# Patient Record
Sex: Female | Born: 1945
Health system: Southern US, Community
[De-identification: ages and names within clinical notes are randomized; demographics above are authoritative.]

## PROBLEM LIST (undated history)

## (undated) DIAGNOSIS — M5432 Sciatica, left side: Secondary | ICD-10-CM

## (undated) DIAGNOSIS — E785 Hyperlipidemia, unspecified: Secondary | ICD-10-CM

## (undated) DIAGNOSIS — E119 Type 2 diabetes mellitus without complications: Secondary | ICD-10-CM

## (undated) DIAGNOSIS — H029 Unspecified disorder of eyelid: Secondary | ICD-10-CM

## (undated) DIAGNOSIS — K219 Gastro-esophageal reflux disease without esophagitis: Secondary | ICD-10-CM

## (undated) DIAGNOSIS — M1990 Unspecified osteoarthritis, unspecified site: Secondary | ICD-10-CM

## (undated) DIAGNOSIS — Z973 Presence of spectacles and contact lenses: Secondary | ICD-10-CM

## (undated) DIAGNOSIS — I10 Essential (primary) hypertension: Secondary | ICD-10-CM

## (undated) DIAGNOSIS — G629 Polyneuropathy, unspecified: Secondary | ICD-10-CM

## (undated) DIAGNOSIS — K08109 Complete loss of teeth, unspecified cause, unspecified class: Secondary | ICD-10-CM

## (undated) DIAGNOSIS — Z9889 Other specified postprocedural states: Secondary | ICD-10-CM

## (undated) DIAGNOSIS — Z972 Presence of dental prosthetic device (complete) (partial): Secondary | ICD-10-CM

## (undated) DIAGNOSIS — E039 Hypothyroidism, unspecified: Secondary | ICD-10-CM

## (undated) HISTORY — PX: OTHER SURGICAL HISTORY: SHX169

## (undated) HISTORY — PX: ABDOMINAL HYSTERECTOMY: SHX81

## (undated) HISTORY — DX: Gastro-esophageal reflux disease without esophagitis: K21.9

## (undated) HISTORY — PX: THORACOTOMY: SHX5074

## (undated) HISTORY — DX: Essential (primary) hypertension: I10

---

## 1999-02-10 ENCOUNTER — Other Ambulatory Visit: Admission: RE | Admit: 1999-02-10 | Discharge: 1999-02-10 | Payer: Self-pay | Admitting: Obstetrics and Gynecology

## 1999-03-07 ENCOUNTER — Encounter: Payer: Self-pay | Admitting: General Surgery

## 1999-03-07 HISTORY — PX: LAPAROSCOPIC CHOLECYSTECTOMY: SUR755

## 1999-03-09 ENCOUNTER — Ambulatory Visit (HOSPITAL_COMMUNITY): Admission: RE | Admit: 1999-03-09 | Discharge: 1999-03-10 | Payer: Self-pay | Admitting: General Surgery

## 1999-03-09 ENCOUNTER — Encounter: Payer: Self-pay | Admitting: General Surgery

## 2000-02-23 ENCOUNTER — Other Ambulatory Visit: Admission: RE | Admit: 2000-02-23 | Discharge: 2000-02-23 | Payer: Self-pay | Admitting: Obstetrics and Gynecology

## 2001-04-01 ENCOUNTER — Other Ambulatory Visit: Admission: RE | Admit: 2001-04-01 | Discharge: 2001-04-01 | Payer: Self-pay | Admitting: Obstetrics and Gynecology

## 2002-04-08 ENCOUNTER — Other Ambulatory Visit: Admission: RE | Admit: 2002-04-08 | Discharge: 2002-04-08 | Payer: Self-pay | Admitting: Obstetrics and Gynecology

## 2003-04-14 ENCOUNTER — Other Ambulatory Visit: Admission: RE | Admit: 2003-04-14 | Discharge: 2003-04-14 | Payer: Self-pay | Admitting: Obstetrics and Gynecology

## 2004-02-16 ENCOUNTER — Encounter: Admission: RE | Admit: 2004-02-16 | Discharge: 2004-02-16 | Payer: Self-pay | Admitting: Cardiology

## 2004-04-16 ENCOUNTER — Inpatient Hospital Stay (HOSPITAL_COMMUNITY): Admission: RE | Admit: 2004-04-16 | Discharge: 2004-04-17 | Payer: Self-pay | Admitting: General Surgery

## 2004-06-27 ENCOUNTER — Other Ambulatory Visit: Admission: RE | Admit: 2004-06-27 | Discharge: 2004-06-27 | Payer: Self-pay | Admitting: Obstetrics and Gynecology

## 2006-11-21 ENCOUNTER — Encounter: Admission: RE | Admit: 2006-11-21 | Discharge: 2007-02-19 | Payer: Self-pay | Admitting: Internal Medicine

## 2009-09-07 ENCOUNTER — Emergency Department (HOSPITAL_COMMUNITY): Admission: EM | Admit: 2009-09-07 | Discharge: 2009-09-07 | Payer: Self-pay | Admitting: Emergency Medicine

## 2009-09-08 ENCOUNTER — Encounter: Admission: RE | Admit: 2009-09-08 | Discharge: 2009-09-16 | Payer: Self-pay | Admitting: Family Medicine

## 2009-10-19 ENCOUNTER — Emergency Department (HOSPITAL_COMMUNITY): Admission: EM | Admit: 2009-10-19 | Discharge: 2009-10-19 | Payer: Self-pay | Admitting: Family Medicine

## 2010-01-06 ENCOUNTER — Ambulatory Visit: Payer: Self-pay | Admitting: Internal Medicine

## 2010-02-07 ENCOUNTER — Ambulatory Visit: Payer: Self-pay | Admitting: Family Medicine

## 2010-02-07 LAB — CONVERTED CEMR LAB
ALT: 49 units/L — ABNORMAL HIGH (ref 0–35)
AST: 38 units/L — ABNORMAL HIGH (ref 0–37)
Albumin: 4.9 g/dL (ref 3.5–5.2)
Alkaline Phosphatase: 65 units/L (ref 39–117)
BUN: 19 mg/dL (ref 6–23)
Basophils Absolute: 0 10*3/uL (ref 0.0–0.1)
Basophils Relative: 0 % (ref 0–1)
CO2: 19 meq/L (ref 19–32)
Calcium: 9.8 mg/dL (ref 8.4–10.5)
Chloride: 109 meq/L (ref 96–112)
Cholesterol: 119 mg/dL (ref 0–200)
Creatinine, Ser: 0.74 mg/dL (ref 0.40–1.20)
Eosinophils Absolute: 0.1 10*3/uL (ref 0.0–0.7)
Eosinophils Relative: 1 % (ref 0–5)
Glucose, Bld: 175 mg/dL — ABNORMAL HIGH (ref 70–99)
HCT: 41.9 % (ref 36.0–46.0)
HDL: 39 mg/dL — ABNORMAL LOW (ref >39)
Hemoglobin: 12.8 g/dL (ref 12.0–15.0)
LDL Cholesterol: 58 mg/dL (ref 0–99)
Lymphocytes Relative: 46 % (ref 12–46)
Lymphs Abs: 6.4 10*3/uL — ABNORMAL HIGH (ref 0.7–4.0)
MCHC: 30.5 g/dL (ref 30.0–36.0)
MCV: 95 fL (ref 78.0–100.0)
Monocytes Absolute: 0.7 10*3/uL (ref 0.1–1.0)
Monocytes Relative: 5 % (ref 3–12)
Neutro Abs: 6.4 10*3/uL (ref 1.7–7.7)
Neutrophils Relative %: 46 % (ref 43–77)
Platelets: 364 10*3/uL (ref 150–400)
Potassium: 4.1 meq/L (ref 3.5–5.3)
RBC: 4.41 M/uL (ref 3.87–5.11)
RDW: 15.4 % (ref 11.5–15.5)
Sodium: 145 meq/L (ref 135–145)
TSH: 3.074 microintl units/mL (ref 0.350–4.500)
Total Bilirubin: 0.4 mg/dL (ref 0.3–1.2)
Total CHOL/HDL Ratio: 3.1
Total Protein: 7.2 g/dL (ref 6.0–8.3)
Triglycerides: 111 mg/dL (ref <150)
VLDL: 22 mg/dL (ref 0–40)
Vit D, 25-Hydroxy: 18 ng/mL — ABNORMAL LOW (ref 30–89)
WBC: 13.7 10*3/uL — ABNORMAL HIGH (ref 4.0–10.5)

## 2010-02-10 ENCOUNTER — Ambulatory Visit: Payer: Self-pay | Admitting: Internal Medicine

## 2010-02-17 ENCOUNTER — Ambulatory Visit (HOSPITAL_COMMUNITY): Admission: RE | Admit: 2010-02-17 | Discharge: 2010-02-17 | Payer: Self-pay | Admitting: Internal Medicine

## 2010-03-10 ENCOUNTER — Encounter (INDEPENDENT_AMBULATORY_CARE_PROVIDER_SITE_OTHER): Payer: Self-pay | Admitting: Family Medicine

## 2010-03-10 ENCOUNTER — Ambulatory Visit: Payer: Self-pay | Admitting: Internal Medicine

## 2010-03-10 LAB — CONVERTED CEMR LAB
ALT: 36 units/L — ABNORMAL HIGH (ref 0–35)
AST: 36 units/L (ref 0–37)
Albumin: 4.5 g/dL (ref 3.5–5.2)
Alkaline Phosphatase: 64 units/L (ref 39–117)
BUN: 18 mg/dL (ref 6–23)
Basophils Absolute: 0 10*3/uL (ref 0.0–0.1)
Basophils Relative: 0 % (ref 0–1)
CO2: 23 meq/L (ref 19–32)
Calcium: 10.3 mg/dL (ref 8.4–10.5)
Chloride: 105 meq/L (ref 96–112)
Creatinine, Ser: 0.88 mg/dL (ref 0.40–1.20)
Eosinophils Absolute: 0.1 10*3/uL (ref 0.0–0.7)
Eosinophils Relative: 1 % (ref 0–5)
Glucose, Bld: 117 mg/dL — ABNORMAL HIGH (ref 70–99)
HCT: 41.5 % (ref 36.0–46.0)
Hemoglobin: 12.9 g/dL (ref 12.0–15.0)
Lymphocytes Relative: 45 % (ref 12–46)
Lymphs Abs: 5.8 10*3/uL — ABNORMAL HIGH (ref 0.7–4.0)
MCHC: 31.1 g/dL (ref 30.0–36.0)
MCV: 95.8 fL (ref 78.0–100.0)
Monocytes Absolute: 0.9 10*3/uL (ref 0.1–1.0)
Monocytes Relative: 7 % (ref 3–12)
Neutro Abs: 6.2 10*3/uL (ref 1.7–7.7)
Neutrophils Relative %: 48 % (ref 43–77)
Platelets: 379 10*3/uL (ref 150–400)
Potassium: 4.4 meq/L (ref 3.5–5.3)
RBC: 4.33 M/uL (ref 3.87–5.11)
RDW: 15.7 % — ABNORMAL HIGH (ref 11.5–15.5)
Sodium: 141 meq/L (ref 135–145)
Total Bilirubin: 0.4 mg/dL (ref 0.3–1.2)
Total Protein: 7.2 g/dL (ref 6.0–8.3)
WBC: 13 10*3/uL — ABNORMAL HIGH (ref 4.0–10.5)

## 2010-03-11 ENCOUNTER — Encounter (INDEPENDENT_AMBULATORY_CARE_PROVIDER_SITE_OTHER): Payer: Self-pay | Admitting: Family Medicine

## 2010-03-11 LAB — CONVERTED CEMR LAB
HCV Ab: NEGATIVE
Hep A Total Ab: POSITIVE — AB
Hep B Core Total Ab: NEGATIVE
Hep B S Ab: NEGATIVE

## 2010-03-17 ENCOUNTER — Encounter (INDEPENDENT_AMBULATORY_CARE_PROVIDER_SITE_OTHER): Payer: Self-pay | Admitting: Family Medicine

## 2010-03-17 ENCOUNTER — Ambulatory Visit: Payer: Self-pay | Admitting: Internal Medicine

## 2010-03-17 LAB — CONVERTED CEMR LAB
Absolute CD4: 2013 #/uL — ABNORMAL HIGH (ref 381–1469)
Basophils Absolute: 0.1 10*3/uL (ref 0.0–0.1)
Basophils Relative: 0 % (ref 0–1)
CD4 T Helper %: 40 % (ref 32–62)
CRP: 0.9 mg/dL — ABNORMAL HIGH (ref <0.6)
EBV NA IgG: 2.12 — ABNORMAL HIGH
EBV VCA IgG: 6.22 — ABNORMAL HIGH
EBV VCA IgM: 0.22
Eosinophils Absolute: 0.2 10*3/uL (ref 0.0–0.7)
Eosinophils Relative: 1 % (ref 0–5)
HCT: 41.4 % (ref 36.0–46.0)
Hemoglobin: 13.1 g/dL (ref 12.0–15.0)
Lymphocytes Relative: 36 % (ref 12–46)
Lymphs Abs: 4.9 10*3/uL — ABNORMAL HIGH (ref 0.7–4.0)
MCHC: 31.6 g/dL (ref 30.0–36.0)
MCV: 95.2 fL (ref 78.0–100.0)
Monocytes Absolute: 0.9 10*3/uL (ref 0.1–1.0)
Monocytes Relative: 6 % (ref 3–12)
Neutro Abs: 7.5 10*3/uL (ref 1.7–7.7)
Neutrophils Relative %: 56 % (ref 43–77)
Platelets: 354 10*3/uL (ref 150–400)
RBC: 4.35 M/uL (ref 3.87–5.11)
RDW: 15.7 % — ABNORMAL HIGH (ref 11.5–15.5)
Sed Rate: 22 mm/hr (ref 0–22)
Total lymphocyte count: 5032 cells/mcL — ABNORMAL HIGH (ref 700–3300)
WBC: 13.5 10*3/uL — ABNORMAL HIGH (ref 4.0–10.5)

## 2010-04-19 ENCOUNTER — Ambulatory Visit: Payer: Self-pay | Admitting: Family Medicine

## 2011-02-07 LAB — GLUCOSE, CAPILLARY: Glucose-Capillary: 288 mg/dL — ABNORMAL HIGH (ref 70–99)

## 2011-02-08 LAB — POCT I-STAT, CHEM 8
BUN: 16 mg/dL (ref 6–23)
Calcium, Ion: 1.18 mmol/L (ref 1.12–1.32)
Chloride: 105 mEq/L (ref 96–112)
Creatinine, Ser: 0.6 mg/dL (ref 0.4–1.2)
Glucose, Bld: 360 mg/dL — ABNORMAL HIGH (ref 70–99)
HCT: 44 % (ref 36.0–46.0)
Hemoglobin: 15 g/dL (ref 12.0–15.0)
Potassium: 4 mEq/L (ref 3.5–5.1)
Sodium: 139 mEq/L (ref 135–145)
TCO2: 25 mmol/L (ref 0–100)

## 2011-02-08 LAB — HEMOGLOBIN A1C
Hgb A1c MFr Bld: 11.8 % — ABNORMAL HIGH (ref 4.6–6.1)
Mean Plasma Glucose: 292 mg/dL

## 2011-03-24 NOTE — Op Note (Signed)
NAME:  Vanessa Morrow, Vanessa Morrow                           ACCOUNT NO.:  [id number]   MEDICAL RECORD NO.:  [id number]                   PATIENT TYPE:  AMB   LOCATION:  DAY                                  FACILITY:  [facility]   PHYSICIAN:  Sharlet Salina T. Hoxworth, M.D.          DATE OF BIRTH:  1946/10/29   DATE OF PROCEDURE:  04/15/2004  DATE OF DISCHARGE:                                 OPERATIVE REPORT   PREOPERATIVE DIAGNOSIS:  Ventral abdominal hernia.   POSTOPERATIVE DIAGNOSIS:  Ventral abdominal hernia.   SURGICAL PROCEDURE:  Laparoscopic repair of ventral abdominal hernia.   SURGEON:  Lorne Skeens. Hoxworth, M.D.   ANESTHESIA:  General.   BRIEF HISTORY:  Vanessa Morrow is a 65 year old black female who I have known  for several years with an enlarging and symptomatic, essentially very  discrete diastasis recti or primary upper abdominal ventral hernia.  She has  had repair remotely of an umbilical hernia with a significant transverse  incision just above the umbilicus.  She now has a good sized, very discrete  bulge extending from several centimeters above the umbilicus to near the  xiphoid.  This gives her significant discomfort.  We have elected to proceed  with laparoscopic repair.  The nature of the procedure, indications, risks  of bleeding, infection, intestinal injury, and recurrence were discussed x2.  She was taken to the operating room for this procedure.   DESCRIPTION OF PROCEDURE:  Patient was brought to the operating room and  placed in a supine position on the operating room table.  General  endotracheal anesthesia was induced.  PAS were in place.  She received broad-  spectrum antibiotics.  The abdomen was widely sterilely prepped and draped.  Access was obtained well laterally in the left flank with a 1 cm incision  under direct vision using the Hasson trocar without difficulty.  Through a  mattress suture of 0 Vicryl, the Hasson was inserted, and pneumoperitoneum  established.   There were no adhesions to the anterior abdominal wall.  There  could be seen scarring around the umbilical area from her previous hernia  repair.  There was a rather broad, diffuse bulge in the epigastrium.  She  had a prominent falciform ligament which I felt needed to be taken down to  allow the mesh to seat.  Two additional trocars were placed, 5 mm.  One in  the upper left flank and one in the right flank.  Using a harmonic scalpel,  the falciform ligament was completely taken down off the anterior abdominal  wall to allow seating of the mesh.  The defect was measured.  I then used a  20 x 15 cm piece of Parietex dual mesh, which allowed broad coverage of the  entire area with several centimeter overlap in all directions.  Six 0  Prolene sutures were placed around the periphery of the mesh.  Six  corresponding sites on the  anterior abdominal wall were then marked several  centimeters outside of the mesh, and small stab incisions were made at these  points.  The mesh was moistened, curled, and passed into the abdominal  cavity without difficulty.  It was unfurled and oriented.  Suture grafts  were then used to bring the sutures up through the individual small stab  incisions.  The mesh was then elevated with the sutures, and it seemed to be  nicely, very tautly deployed across the entire upper abdomen.  These sutures  were then tied and cut.  [place]ak was then used to circumferentially tack the  edge of the mesh.  The bed was inspected for hemostasis, which appeared  complete.  Trocars were removed and all  CO2 evacuated.  The mattress suture was secured at the [place] site.  Skin  incisions were closed with interrupted subcuticular 4-0 Monocryl and Steri-  Strips.  Sponge and needle counts were correct.  Dry sterile dressings were  applied.  Patient was taken to the recovery room in good condition.                                               Lorne Skeens. Hoxworth, M.D.    Tory Emerald   D:  04/15/2004  T:  04/15/2004  Job:  [id number]

## 2013-03-26 ENCOUNTER — Encounter: Payer: Self-pay | Admitting: Pulmonary Disease

## 2013-03-27 ENCOUNTER — Institutional Professional Consult (permissible substitution): Payer: Self-pay | Admitting: Pulmonary Disease

## 2013-04-21 ENCOUNTER — Institutional Professional Consult (permissible substitution): Payer: Self-pay | Admitting: Pulmonary Disease

## 2013-05-20 ENCOUNTER — Institutional Professional Consult (permissible substitution): Payer: Self-pay | Admitting: Pulmonary Disease

## 2013-05-22 ENCOUNTER — Encounter: Payer: Self-pay | Admitting: Pulmonary Disease

## 2014-11-12 DIAGNOSIS — G4700 Insomnia, unspecified: Secondary | ICD-10-CM | POA: Diagnosis not present

## 2014-11-12 DIAGNOSIS — I10 Essential (primary) hypertension: Secondary | ICD-10-CM | POA: Diagnosis not present

## 2014-11-12 DIAGNOSIS — K30 Functional dyspepsia: Secondary | ICD-10-CM | POA: Diagnosis not present

## 2014-11-12 DIAGNOSIS — M2550 Pain in unspecified joint: Secondary | ICD-10-CM | POA: Diagnosis not present

## 2014-11-12 DIAGNOSIS — E119 Type 2 diabetes mellitus without complications: Secondary | ICD-10-CM | POA: Diagnosis not present

## 2014-11-12 DIAGNOSIS — E1151 Type 2 diabetes mellitus with diabetic peripheral angiopathy without gangrene: Secondary | ICD-10-CM | POA: Diagnosis not present

## 2015-01-12 DIAGNOSIS — Z01118 Encounter for examination of ears and hearing with other abnormal findings: Secondary | ICD-10-CM | POA: Diagnosis not present

## 2015-01-12 DIAGNOSIS — Z7689 Persons encountering health services in other specified circumstances: Secondary | ICD-10-CM | POA: Diagnosis not present

## 2015-01-12 DIAGNOSIS — E1151 Type 2 diabetes mellitus with diabetic peripheral angiopathy without gangrene: Secondary | ICD-10-CM | POA: Diagnosis not present

## 2015-01-12 DIAGNOSIS — Z136 Encounter for screening for cardiovascular disorders: Secondary | ICD-10-CM | POA: Diagnosis not present

## 2015-01-12 DIAGNOSIS — Z1389 Encounter for screening for other disorder: Secondary | ICD-10-CM | POA: Diagnosis not present

## 2015-01-12 DIAGNOSIS — Z0000 Encounter for general adult medical examination without abnormal findings: Secondary | ICD-10-CM | POA: Diagnosis not present

## 2015-01-12 DIAGNOSIS — I10 Essential (primary) hypertension: Secondary | ICD-10-CM | POA: Diagnosis not present

## 2015-01-12 DIAGNOSIS — E119 Type 2 diabetes mellitus without complications: Secondary | ICD-10-CM | POA: Diagnosis not present

## 2015-01-12 DIAGNOSIS — Z113 Encounter for screening for infections with a predominantly sexual mode of transmission: Secondary | ICD-10-CM | POA: Diagnosis not present

## 2015-02-26 DIAGNOSIS — K30 Functional dyspepsia: Secondary | ICD-10-CM | POA: Diagnosis not present

## 2015-02-26 DIAGNOSIS — G4700 Insomnia, unspecified: Secondary | ICD-10-CM | POA: Diagnosis not present

## 2015-02-26 DIAGNOSIS — E1151 Type 2 diabetes mellitus with diabetic peripheral angiopathy without gangrene: Secondary | ICD-10-CM | POA: Diagnosis not present

## 2015-02-26 DIAGNOSIS — I10 Essential (primary) hypertension: Secondary | ICD-10-CM | POA: Diagnosis not present

## 2015-02-26 DIAGNOSIS — R635 Abnormal weight gain: Secondary | ICD-10-CM | POA: Diagnosis not present

## 2015-06-30 DIAGNOSIS — Z1231 Encounter for screening mammogram for malignant neoplasm of breast: Secondary | ICD-10-CM | POA: Diagnosis not present

## 2015-07-23 DIAGNOSIS — R0602 Shortness of breath: Secondary | ICD-10-CM | POA: Diagnosis not present

## 2015-07-23 DIAGNOSIS — E1151 Type 2 diabetes mellitus with diabetic peripheral angiopathy without gangrene: Secondary | ICD-10-CM | POA: Diagnosis not present

## 2015-07-23 DIAGNOSIS — I10 Essential (primary) hypertension: Secondary | ICD-10-CM | POA: Diagnosis not present

## 2015-07-23 DIAGNOSIS — E1140 Type 2 diabetes mellitus with diabetic neuropathy, unspecified: Secondary | ICD-10-CM | POA: Diagnosis not present

## 2015-07-23 DIAGNOSIS — R079 Chest pain, unspecified: Secondary | ICD-10-CM | POA: Diagnosis not present

## 2015-07-23 DIAGNOSIS — E039 Hypothyroidism, unspecified: Secondary | ICD-10-CM | POA: Diagnosis not present

## 2015-08-13 DIAGNOSIS — I10 Essential (primary) hypertension: Secondary | ICD-10-CM | POA: Diagnosis not present

## 2015-08-27 DIAGNOSIS — E039 Hypothyroidism, unspecified: Secondary | ICD-10-CM | POA: Diagnosis not present

## 2015-08-27 DIAGNOSIS — E1151 Type 2 diabetes mellitus with diabetic peripheral angiopathy without gangrene: Secondary | ICD-10-CM | POA: Diagnosis not present

## 2015-08-27 DIAGNOSIS — E1140 Type 2 diabetes mellitus with diabetic neuropathy, unspecified: Secondary | ICD-10-CM | POA: Diagnosis not present

## 2015-08-27 DIAGNOSIS — Z0000 Encounter for general adult medical examination without abnormal findings: Secondary | ICD-10-CM | POA: Diagnosis not present

## 2015-08-27 DIAGNOSIS — Z1389 Encounter for screening for other disorder: Secondary | ICD-10-CM | POA: Diagnosis not present

## 2015-10-08 DIAGNOSIS — H538 Other visual disturbances: Secondary | ICD-10-CM | POA: Diagnosis not present

## 2015-10-08 DIAGNOSIS — H43811 Vitreous degeneration, right eye: Secondary | ICD-10-CM | POA: Diagnosis not present

## 2015-10-08 DIAGNOSIS — H2511 Age-related nuclear cataract, right eye: Secondary | ICD-10-CM | POA: Diagnosis not present

## 2015-11-25 DIAGNOSIS — E1151 Type 2 diabetes mellitus with diabetic peripheral angiopathy without gangrene: Secondary | ICD-10-CM | POA: Diagnosis not present

## 2015-11-25 DIAGNOSIS — E1140 Type 2 diabetes mellitus with diabetic neuropathy, unspecified: Secondary | ICD-10-CM | POA: Diagnosis not present

## 2015-11-25 DIAGNOSIS — I10 Essential (primary) hypertension: Secondary | ICD-10-CM | POA: Diagnosis not present

## 2015-11-25 DIAGNOSIS — K219 Gastro-esophageal reflux disease without esophagitis: Secondary | ICD-10-CM | POA: Diagnosis not present

## 2015-11-25 DIAGNOSIS — E039 Hypothyroidism, unspecified: Secondary | ICD-10-CM | POA: Diagnosis not present

## 2015-12-03 DIAGNOSIS — I209 Angina pectoris, unspecified: Secondary | ICD-10-CM | POA: Diagnosis not present

## 2015-12-03 DIAGNOSIS — I10 Essential (primary) hypertension: Secondary | ICD-10-CM | POA: Diagnosis not present

## 2015-12-03 DIAGNOSIS — E119 Type 2 diabetes mellitus without complications: Secondary | ICD-10-CM | POA: Diagnosis not present

## 2015-12-03 DIAGNOSIS — R0609 Other forms of dyspnea: Secondary | ICD-10-CM | POA: Diagnosis not present

## 2015-12-04 ENCOUNTER — Emergency Department (HOSPITAL_COMMUNITY): Payer: Medicare Other

## 2015-12-04 ENCOUNTER — Emergency Department (HOSPITAL_COMMUNITY)
Admission: EM | Admit: 2015-12-04 | Discharge: 2015-12-04 | Disposition: A | Discharge status: Home / Self Care | Payer: Medicare Other | Attending: Emergency Medicine | Admitting: Emergency Medicine

## 2015-12-04 ENCOUNTER — Encounter (HOSPITAL_COMMUNITY): Payer: Self-pay | Admitting: *Deleted

## 2015-12-04 DIAGNOSIS — Z79899 Other long term (current) drug therapy: Secondary | ICD-10-CM | POA: Diagnosis not present

## 2015-12-04 DIAGNOSIS — S6992XA Unspecified injury of left wrist, hand and finger(s), initial encounter: Secondary | ICD-10-CM | POA: Insufficient documentation

## 2015-12-04 DIAGNOSIS — Y9389 Activity, other specified: Secondary | ICD-10-CM | POA: Insufficient documentation

## 2015-12-04 DIAGNOSIS — M25532 Pain in left wrist: Secondary | ICD-10-CM

## 2015-12-04 DIAGNOSIS — Z7984 Long term (current) use of oral hypoglycemic drugs: Secondary | ICD-10-CM | POA: Diagnosis not present

## 2015-12-04 DIAGNOSIS — X500XXA Overexertion from strenuous movement or load, initial encounter: Secondary | ICD-10-CM | POA: Diagnosis not present

## 2015-12-04 DIAGNOSIS — E119 Type 2 diabetes mellitus without complications: Secondary | ICD-10-CM | POA: Diagnosis not present

## 2015-12-04 DIAGNOSIS — Y998 Other external cause status: Secondary | ICD-10-CM | POA: Diagnosis not present

## 2015-12-04 DIAGNOSIS — K219 Gastro-esophageal reflux disease without esophagitis: Secondary | ICD-10-CM | POA: Diagnosis not present

## 2015-12-04 DIAGNOSIS — I10 Essential (primary) hypertension: Secondary | ICD-10-CM | POA: Diagnosis not present

## 2015-12-04 DIAGNOSIS — Y9289 Other specified places as the place of occurrence of the external cause: Secondary | ICD-10-CM | POA: Diagnosis not present

## 2015-12-04 MED ORDER — ACETAMINOPHEN 500 MG PO TABS: 500.0000 mg | ORAL_TABLET | Freq: Four times a day (QID) | ORAL | Status: DC | PRN

## 2015-12-04 MED ORDER — OXYCODONE-ACETAMINOPHEN 5-325 MG PO TABS
1.0000 | ORAL_TABLET | Freq: Once | ORAL | Status: AC
  Administered 2015-12-04: 1 via ORAL

## 2015-12-04 MED ORDER — OXYCODONE-ACETAMINOPHEN 5-325 MG PO TABS
ORAL_TABLET | ORAL | Status: AC
  Filled 2015-12-04: qty 1

## 2015-12-04 NOTE — ED Provider Notes (Signed)
Medical screening examination/treatment/procedure(s) were conducted as a shared visit with non-physician practitioner(s) and myself.  I personally evaluated the patient during the encounter.   EKG Interpretation None       Results for orders placed or performed in visit on 03/17/10  Converted [facility] Lab  Result Value Ref Range   WBC 13.5 (H) 4.0-10.5 10*3/microliter   RBC 4.35 3.87-5.11 M/uL   Hemoglobin 13.1 12.0-15.0 g/dL   HCT 41.4 36.0-46.0 %   MCV 95.2 78.0-100.0 fL   MCHC 31.6 30.0-36.0 g/dL   RDW 15.7 (H) 11.5-15.5 %   Platelets 354 150-400 K/uL   Neutrophils Relative % 56 43-77 %   Neutro Abs 7.5 1.7-7.7 K/uL   Lymphocytes Relative 36 12-46 %   Lymphs Abs 4.9 (H) 0.7-4.0 K/uL   Monocytes Relative 6 3-12 %   Monocytes Absolute 0.9 0.1-1.0 K/uL   Eosinophils Relative 1 0-5 %   Eosinophils Absolute 0.2 0.0-0.7 K/uL   Basophils Relative 0 0-1 %   Basophils Absolute 0.1 0.0-0.1 K/uL   WBC Morphology Criteria for review not met    RBC Morphology Criteria for review not met    Sed Rate 22 0-22 mm/hr   HIV NON REAC NON REAC   CRP 0.9 (H) <0.6 mg/dL   RPR Ser Ql NON REAC NON REAC   EBV VCA IgG 6.22 ISR (H)    EBV VCA IgM 0.22 ISR    EBV NA IgG 2.12 (H)    Total lymphocyte count 5032 (H) 916 757 2121 cells/mcL   CD4 T Helper % 40 32-62 %   Absolute CD4 2013 (H) [phone] #/uL   Dg Wrist Complete Left  12/04/2015  CLINICAL DATA:  70 year old female with acute left wrist pain and swelling for 3 days. No known injury. EXAM: LEFT WRIST - COMPLETE 3+ VIEW COMPARISON:  None. FINDINGS: There is no evidence of acute fracture, subluxation or dislocation. Soft tissue swelling is present. No focal bony lesions are identified. The joint spaces are within normal limits. IMPRESSION: Soft tissue swelling without acute bony abnormality. Electronically Signed   By: Margarette Canada M.D.   On: 12/04/2015 14:20    Patient with complaint of left wrist pain for 3 days. No direct injury or trauma. X-ray of  the wrist is negative. Patient's left wrist is swollen no erythema but increased warmth suggestive of an inflammatory process like arthritis. Will be treated symptomatically. Patient's radial pulses 2+. Refill to fingers is a 2 seconds. Sensation intact.  Fredia Sorrow, MD 12/04/15 1504

## 2015-12-04 NOTE — Discharge Instructions (Signed)
Wrist Pain There are many things that can cause wrist pain. Some common causes include:  An injury to the wrist area, such as a sprain, strain, or fracture.  Overuse of the joint.  A condition that causes increased pressure on a nerve in the wrist (carpal tunnel syndrome).  Wear and tear of the joints that occurs with aging (osteoarthritis).  A variety of other types of arthritis. Sometimes, the cause of wrist pain is not known. The pain often goes away when you follow your health care provider's instructions for relieving pain at home. If your wrist pain continues, tests may need to be done to diagnose your condition. HOME CARE INSTRUCTIONS Pay attention to any changes in your symptoms. Take these actions to help with your pain:  Rest the wrist area for at least 48 hours or as told by your health care provider.  If directed, apply ice to the injured area:  Put ice in a plastic bag.  Place a towel between your skin and the bag.  Leave the ice on for 20 minutes, 2-3 times per day.  Keep your arm raised (elevated) above the level of your heart while you are sitting or lying down.  If a splint or elastic bandage has been applied, use it as told by your health care provider.  Remove the splint or bandage only as told by your health care provider.  Loosen the splint or bandage if your fingers become numb or have a tingling feeling, or if they turn cold or blue.  Take over-the-counter and prescription medicines only as told by your health care provider.  Keep all follow-up visits as told by your health care provider. This is important. SEEK MEDICAL CARE IF:  Your pain is not helped by treatment.  Your pain gets worse. SEEK IMMEDIATE MEDICAL CARE IF:  Your fingers become swollen.  Your fingers turn white, very red, or cold and blue.  Your fingers are numb or have a tingling feeling.  You have difficulty moving your fingers.   This information is not intended to replace  advice given to you by your health care provider. Make sure you discuss any questions you have with your health care provider.   Document Released: 08/02/2005 Document Revised: 07/14/2015 Document Reviewed: 03/10/2015 Elsevier Interactive Patient Education 2016 [employer] Wrist Splint A splint is a medical device that keeps an injured part of your body from moving. A splint supports your wrist like a cast, but it is more flexible. It can be removed or loosened. The supporting part of a splint does not completely surround your wrist. It is held in place with an elastic band or straps. You may need a wrist splint if you have hurt your wrist or if you have a condition that causes swelling. Depending on the type of wrist problem you have, your splint may extend up your arm, onto your hand, or around your thumb. The wrist splint may be worn to:  Support your wrist.  Protect your injury.  Prevent further injury.  Prevent movement.  Reduce pain.  Promote healing. RISKS AND COMPLICATIONS The most dangerous complication of wearing a splint is having a reduced blood supply to your wrist or hand. This can happen if there is a lot of swelling or if the splint is too tight. This results in a condition called compartment syndrome and can cause permanent damage. Symptoms include:  Pain that is getting worse.  Tingling and numbness.  Changes in skin color (paleness or a  bluish color).  Cold fingers. Other complications of wearing a splint can include:   Skin irritation that can cause:  Itching.  Rash.  Skin sores.  Skin infection.  Wrist stiffness. This can occur if you have worn a splint for a long time. HOW TO USE YOUR WRIST SPLINT Your wrist splint should be tight enough to support your wrist without cutting off your blood supply. How long you need to wear the splint depends on the type of wrist problem you have. Your health care provider will instruct you on how to wear your wrist  splint and for how long.  Follow all your health care provider's instructions.  Take medicine only as directed by your health care provider.  Keep your wrist elevated above the level of your heart while resting.  Ice may help reduce pain and swelling.  Place ice in a plastic bag.  Place a towel between your splint and the bag.  Leave the ice on for 20 minutes, 2-3 times a day.  Do not get your splint wet.  Do not push objects under your splint to scratch your skin.  Loosen your splint if it feels too tight. Consult your health care provider if you have questions about how tight to wear the splint.  Keep all follow-up visits as directed by your health care provider. This is important. SEEK MEDICAL CARE IF:  You have wrist pain or swelling that does not go away.  The skin around or under your splint becomes red, itchy, or moist.  You have chills or fever.  Your splint feels too tight or too loose.  Your splint gets damaged. SEEK IMMEDIATE MEDICAL CARE IF: You have symptoms of compartment syndrome. These include:   Pain that is getting worse.  Tingling and numbness.  Changes in skin color (paleness or a bluish color).  Cold fingers. MAKE SURE YOU:  Understand these instructions.  Will watch your condition.  Will get help right away if you are not doing well or get worse.   This information is not intended to replace advice given to you by your health care provider. Make sure you discuss any questions you have with your health care provider.   Document Released: 10/05/2006 Document Revised: 11/13/2014 Document Reviewed: 02/03/2014 Elsevier Interactive Patient Education [employer].

## 2015-12-04 NOTE — ED Provider Notes (Signed)
CSN: [id number]     Arrival date & time 12/04/15  1348 History   First MD Initiated Contact with Patient 12/04/15 1434     Chief Complaint  Patient presents with  . Wrist Pain    SHAREA BOGGS is a 70 y.o. female who presents to the emergency department complaining of left wrist pain for the past 3 days. She is unsure of any specific injury. She reports she has been lifting some heavy objects while cleaning recently. She complains of 6 out of 10 pain currently. She denies any numbness, tingling or weakness. She denies any fevers. She has taken nothing for treatment today.   (Consider location/radiation/quality/duration/timing/severity/associated sxs/prior Treatment) HPI  Past Medical History  Diagnosis Date  . Hypertension   . GERD (gastroesophageal reflux disease)   . DM (diabetes mellitus) ([place])    History reviewed. No pertinent past surgical history. History reviewed. No pertinent family history. Social History  Substance Use Topics  . Smoking status: None  . Smokeless tobacco: None  . Alcohol Use: None   OB History    No data available     Review of Systems  Constitutional: Negative for fever.  Musculoskeletal: Positive for joint swelling and arthralgias.  Skin: Negative for color change, rash and wound.  Neurological: Negative for weakness and numbness.      Allergies  Review of patient's allergies indicates no known allergies.  Home Medications   Prior to Admission medications   Medication Sig Start Date End Date Taking? Authorizing Provider  amLODipine-benazepril (LOTREL) 10-40 MG per capsule Take 1 capsule by mouth daily.   Yes Historical Provider, MD  chlorthalidone (HYGROTON) 25 MG tablet Take 25 mg by mouth daily.   Yes Historical Provider, MD  exenatide (BYETTA 10 MCG PEN) 10 MCG/0.04ML SOPN Inject 10 mcg into the skin 2 (two) times daily with a meal.   Yes Historical Provider, MD  gabapentin (NEURONTIN) 300 MG capsule Take 300 mg by mouth 2 (two) times  daily.   Yes Historical Provider, MD  glimepiride (AMARYL) 2 MG tablet Take 2 mg by mouth daily with breakfast.   Yes Historical Provider, MD  isosorbide mononitrate (IMDUR) 60 MG 24 hr tablet Take 60 mg by mouth daily.   Yes Historical Provider, MD  levothyroxine (SYNTHROID, LEVOTHROID) 50 MCG tablet Take 50 mcg by mouth daily before breakfast.   Yes Historical Provider, MD  pantoprazole (PROTONIX) 40 MG tablet Take 40 mg by mouth daily.   Yes Historical Provider, MD  rosuvastatin (CRESTOR) 20 MG tablet Take 20 mg by mouth daily.   Yes Historical Provider, MD  sitaGLIPtan-metformin (JANUMET) 50-1000 MG per tablet Take 1 tablet by mouth 2 (two) times daily with a meal.   Yes Historical Provider, MD  acetaminophen (TYLENOL) 500 MG tablet Take 1 tablet (500 mg total) by mouth every 6 (six) hours as needed for mild pain or moderate pain. 12/04/15   Waynetta Pean, PA-C  cyclobenzaprine (FLEXERIL) 10 MG tablet Take 10 mg by mouth 3 (three) times daily as needed for muscle spasms.    Historical Provider, MD  HYDROcodone-acetaminophen (NORCO) 7.5-325 MG per tablet Take 1 tablet by mouth every 8 (eight) hours as needed for pain.    Historical Provider, MD   BP 115/62 mmHg  Pulse 76  Temp(Src) 98.2 F (36.8 C) (Oral)  Resp 18  SpO2 100% Physical Exam  Constitutional: She appears well-developed and well-nourished. No distress.  HENT:  Head: Normocephalic and atraumatic.  Eyes: Right eye exhibits no discharge.  Left eye exhibits no discharge.  Cardiovascular: Normal rate, regular rhythm and intact distal pulses.   Bilateral radial pulses are 2+. Good capillary refill to her left distal fingertips.  Pulmonary/Chest: Effort normal. No respiratory distress.  Musculoskeletal: Normal range of motion. She exhibits edema and tenderness.  Mild tenderness and edema noted to the lateral aspect of her left wrist. No deformity noted. Good range of motion of her left wrist. No tenderness to her left elbow or  shoulder. No snuffbox tenderness.  Neurological: She is alert. Coordination normal.  Sensation is intact to her left distal fingertips.  Skin: No rash noted. She is not diaphoretic.  Psychiatric: She has a normal mood and affect. Her behavior is normal.  Nursing note and vitals reviewed.   ED Course  Procedures (including critical care time) Labs Review Labs Reviewed - No data to display  Imaging Review Dg Wrist Complete Left  12/04/2015  CLINICAL DATA:  70 year old female with acute left wrist pain and swelling for 3 days. No known injury. EXAM: LEFT WRIST - COMPLETE 3+ VIEW COMPARISON:  None. FINDINGS: There is no evidence of acute fracture, subluxation or dislocation. Soft tissue swelling is present. No focal bony lesions are identified. The joint spaces are within normal limits. IMPRESSION: Soft tissue swelling without acute bony abnormality. Electronically Signed   By: Margarette Canada M.D.   On: 12/04/2015 14:20   I have personally reviewed and evaluated these images as part of my medical decision-making.   EKG Interpretation None      Filed Vitals:   12/04/15 1400  BP: 115/62  Pulse: 76  Temp: 98.2 F (36.8 C)  TempSrc: Oral  Resp: 18  SpO2: 100%     MDM   Meds given in ED:  Medications  oxyCODONE-acetaminophen (PERCOCET/ROXICET) 5-325 MG per tablet 1 tablet (1 tablet Oral Given 12/04/15 1404)    New Prescriptions   ACETAMINOPHEN (TYLENOL) 500 MG TABLET    Take 1 tablet (500 mg total) by mouth every 6 (six) hours as needed for mild pain or moderate pain.    Final diagnoses:  Left wrist pain   This  is a 70 y.o. female who presents to the emergency department complaining of left wrist pain for the past 3 days. She is unsure of any specific injury. She reports she has been lifting some heavy objects while cleaning recently. She complains of 6 out of 10 pain currently. She denies any numbness, tingling or weakness. On exam the patient is afebrile and nontoxic appearing.  She has tenderness to the lateral aspect of her left wrist. No deformity. No ecchymosis, erythema or warmth. She is neurovascularly intact. Left wrist x-ray shows soft tissue swelling without acute bony abnormality. We'll place the patient and a wrist splint and have her follow-up with her primary care provider and orthopedic hand surgery. I encouraged rest, ice, elevation and Tylenol for pain control. I advised the patient to follow-up with their primary care provider this week. I advised the patient to return to the emergency department with new or worsening symptoms or new concerns. The patient verbalized understanding and agreement with plan.    This patient was discussed with and evaluated by Dr. Rogene Houston who agrees with assessment and plan.    Waynetta Pean, PA-C 12/04/15 [phone]

## 2015-12-04 NOTE — ED Notes (Signed)
Gave pt ice pack, per Sharyn Lull - RN.

## 2015-12-04 NOTE — ED Notes (Signed)
Pt reports pain to left wrist x 3 days but denies any injury.

## 2015-12-10 DIAGNOSIS — I200 Unstable angina: Secondary | ICD-10-CM | POA: Diagnosis not present

## 2015-12-21 DIAGNOSIS — I209 Angina pectoris, unspecified: Secondary | ICD-10-CM | POA: Diagnosis not present

## 2015-12-30 DIAGNOSIS — R0609 Other forms of dyspnea: Secondary | ICD-10-CM | POA: Diagnosis not present

## 2015-12-30 DIAGNOSIS — I209 Angina pectoris, unspecified: Secondary | ICD-10-CM | POA: Diagnosis not present

## 2015-12-30 DIAGNOSIS — I10 Essential (primary) hypertension: Secondary | ICD-10-CM | POA: Diagnosis not present

## 2015-12-30 DIAGNOSIS — E119 Type 2 diabetes mellitus without complications: Secondary | ICD-10-CM | POA: Diagnosis not present

## 2016-01-04 DIAGNOSIS — I10 Essential (primary) hypertension: Secondary | ICD-10-CM | POA: Diagnosis not present

## 2016-01-04 DIAGNOSIS — E1140 Type 2 diabetes mellitus with diabetic neuropathy, unspecified: Secondary | ICD-10-CM | POA: Diagnosis not present

## 2016-01-05 DIAGNOSIS — K1329 Other disturbances of oral epithelium, including tongue: Secondary | ICD-10-CM | POA: Diagnosis not present

## 2016-01-27 DIAGNOSIS — R7989 Other specified abnormal findings of blood chemistry: Secondary | ICD-10-CM | POA: Diagnosis not present

## 2016-01-27 DIAGNOSIS — I129 Hypertensive chronic kidney disease with stage 1 through stage 4 chronic kidney disease, or unspecified chronic kidney disease: Secondary | ICD-10-CM | POA: Diagnosis not present

## 2016-01-27 DIAGNOSIS — N183 Chronic kidney disease, stage 3 (moderate): Secondary | ICD-10-CM | POA: Diagnosis not present

## 2016-01-27 DIAGNOSIS — E1122 Type 2 diabetes mellitus with diabetic chronic kidney disease: Secondary | ICD-10-CM | POA: Diagnosis not present

## 2016-01-27 DIAGNOSIS — R809 Proteinuria, unspecified: Secondary | ICD-10-CM | POA: Diagnosis not present

## 2016-01-27 DIAGNOSIS — N2581 Secondary hyperparathyroidism of renal origin: Secondary | ICD-10-CM | POA: Diagnosis not present

## 2016-01-31 ENCOUNTER — Other Ambulatory Visit: Payer: Self-pay | Admitting: Nephrology

## 2016-01-31 DIAGNOSIS — N183 Chronic kidney disease, stage 3 (moderate): Secondary | ICD-10-CM

## 2016-02-01 ENCOUNTER — Ambulatory Visit
Admission: RE | Admit: 2016-02-01 | Discharge: 2016-02-01 | Disposition: A | Discharge status: Home / Self Care | Payer: Medicare Other | Source: Ambulatory Visit | Attending: Nephrology | Admitting: Nephrology

## 2016-02-01 DIAGNOSIS — N183 Chronic kidney disease, stage 3 (moderate): Secondary | ICD-10-CM

## 2016-02-01 DIAGNOSIS — I129 Hypertensive chronic kidney disease with stage 1 through stage 4 chronic kidney disease, or unspecified chronic kidney disease: Secondary | ICD-10-CM | POA: Diagnosis not present

## 2016-02-03 DIAGNOSIS — I10 Essential (primary) hypertension: Secondary | ICD-10-CM | POA: Diagnosis not present

## 2016-02-03 DIAGNOSIS — E1140 Type 2 diabetes mellitus with diabetic neuropathy, unspecified: Secondary | ICD-10-CM | POA: Diagnosis not present

## 2016-02-08 DIAGNOSIS — Z0000 Encounter for general adult medical examination without abnormal findings: Secondary | ICD-10-CM | POA: Diagnosis not present

## 2016-02-08 DIAGNOSIS — Z01118 Encounter for examination of ears and hearing with other abnormal findings: Secondary | ICD-10-CM | POA: Diagnosis not present

## 2016-02-08 DIAGNOSIS — E1151 Type 2 diabetes mellitus with diabetic peripheral angiopathy without gangrene: Secondary | ICD-10-CM | POA: Diagnosis not present

## 2016-02-08 DIAGNOSIS — I10 Essential (primary) hypertension: Secondary | ICD-10-CM | POA: Diagnosis not present

## 2016-02-08 DIAGNOSIS — H538 Other visual disturbances: Secondary | ICD-10-CM | POA: Diagnosis not present

## 2016-02-17 DIAGNOSIS — I10 Essential (primary) hypertension: Secondary | ICD-10-CM | POA: Diagnosis not present

## 2016-02-17 DIAGNOSIS — E1140 Type 2 diabetes mellitus with diabetic neuropathy, unspecified: Secondary | ICD-10-CM | POA: Diagnosis not present

## 2016-03-09 DIAGNOSIS — I10 Essential (primary) hypertension: Secondary | ICD-10-CM | POA: Diagnosis not present

## 2016-03-09 DIAGNOSIS — E1140 Type 2 diabetes mellitus with diabetic neuropathy, unspecified: Secondary | ICD-10-CM | POA: Diagnosis not present

## 2016-03-29 DIAGNOSIS — I209 Angina pectoris, unspecified: Secondary | ICD-10-CM | POA: Diagnosis not present

## 2016-03-29 DIAGNOSIS — R0609 Other forms of dyspnea: Secondary | ICD-10-CM | POA: Diagnosis not present

## 2016-03-29 DIAGNOSIS — I10 Essential (primary) hypertension: Secondary | ICD-10-CM | POA: Diagnosis not present

## 2016-03-29 DIAGNOSIS — E119 Type 2 diabetes mellitus without complications: Secondary | ICD-10-CM | POA: Diagnosis not present

## 2016-04-11 DIAGNOSIS — E1140 Type 2 diabetes mellitus with diabetic neuropathy, unspecified: Secondary | ICD-10-CM | POA: Diagnosis not present

## 2016-04-11 DIAGNOSIS — I10 Essential (primary) hypertension: Secondary | ICD-10-CM | POA: Diagnosis not present

## 2016-05-10 DIAGNOSIS — E1140 Type 2 diabetes mellitus with diabetic neuropathy, unspecified: Secondary | ICD-10-CM | POA: Diagnosis not present

## 2016-05-10 DIAGNOSIS — I10 Essential (primary) hypertension: Secondary | ICD-10-CM | POA: Diagnosis not present

## 2016-05-18 DIAGNOSIS — I10 Essential (primary) hypertension: Secondary | ICD-10-CM | POA: Diagnosis not present

## 2016-05-18 DIAGNOSIS — N183 Chronic kidney disease, stage 3 (moderate): Secondary | ICD-10-CM | POA: Diagnosis not present

## 2016-05-18 DIAGNOSIS — E1151 Type 2 diabetes mellitus with diabetic peripheral angiopathy without gangrene: Secondary | ICD-10-CM | POA: Diagnosis not present

## 2016-05-18 DIAGNOSIS — Z01810 Encounter for preprocedural cardiovascular examination: Secondary | ICD-10-CM | POA: Diagnosis not present

## 2016-05-18 DIAGNOSIS — Z01811 Encounter for preprocedural respiratory examination: Secondary | ICD-10-CM | POA: Diagnosis not present

## 2016-05-31 DIAGNOSIS — E1140 Type 2 diabetes mellitus with diabetic neuropathy, unspecified: Secondary | ICD-10-CM | POA: Diagnosis not present

## 2016-05-31 DIAGNOSIS — I10 Essential (primary) hypertension: Secondary | ICD-10-CM | POA: Diagnosis not present

## 2016-06-06 DIAGNOSIS — N390 Urinary tract infection, site not specified: Secondary | ICD-10-CM | POA: Diagnosis not present

## 2016-06-06 DIAGNOSIS — I129 Hypertensive chronic kidney disease with stage 1 through stage 4 chronic kidney disease, or unspecified chronic kidney disease: Secondary | ICD-10-CM | POA: Diagnosis not present

## 2016-06-06 DIAGNOSIS — R809 Proteinuria, unspecified: Secondary | ICD-10-CM | POA: Diagnosis not present

## 2016-06-06 DIAGNOSIS — R7989 Other specified abnormal findings of blood chemistry: Secondary | ICD-10-CM | POA: Diagnosis not present

## 2016-06-06 DIAGNOSIS — N183 Chronic kidney disease, stage 3 (moderate): Secondary | ICD-10-CM | POA: Diagnosis not present

## 2016-06-06 DIAGNOSIS — N2581 Secondary hyperparathyroidism of renal origin: Secondary | ICD-10-CM | POA: Diagnosis not present

## 2016-06-06 DIAGNOSIS — E1122 Type 2 diabetes mellitus with diabetic chronic kidney disease: Secondary | ICD-10-CM | POA: Diagnosis not present

## 2016-06-19 DIAGNOSIS — I10 Essential (primary) hypertension: Secondary | ICD-10-CM | POA: Diagnosis not present

## 2016-06-19 DIAGNOSIS — E1140 Type 2 diabetes mellitus with diabetic neuropathy, unspecified: Secondary | ICD-10-CM | POA: Diagnosis not present

## 2016-07-24 DIAGNOSIS — R0789 Other chest pain: Secondary | ICD-10-CM | POA: Diagnosis not present

## 2016-07-24 DIAGNOSIS — N183 Chronic kidney disease, stage 3 (moderate): Secondary | ICD-10-CM | POA: Diagnosis not present

## 2016-07-24 DIAGNOSIS — E1151 Type 2 diabetes mellitus with diabetic peripheral angiopathy without gangrene: Secondary | ICD-10-CM | POA: Diagnosis not present

## 2016-07-24 DIAGNOSIS — I10 Essential (primary) hypertension: Secondary | ICD-10-CM | POA: Diagnosis not present

## 2016-07-24 DIAGNOSIS — E1140 Type 2 diabetes mellitus with diabetic neuropathy, unspecified: Secondary | ICD-10-CM | POA: Diagnosis not present

## 2016-08-04 DIAGNOSIS — I10 Essential (primary) hypertension: Secondary | ICD-10-CM | POA: Diagnosis not present

## 2016-08-04 DIAGNOSIS — E1140 Type 2 diabetes mellitus with diabetic neuropathy, unspecified: Secondary | ICD-10-CM | POA: Diagnosis not present

## 2016-08-28 DIAGNOSIS — N183 Chronic kidney disease, stage 3 (moderate): Secondary | ICD-10-CM | POA: Diagnosis not present

## 2016-08-28 DIAGNOSIS — I10 Essential (primary) hypertension: Secondary | ICD-10-CM | POA: Diagnosis not present

## 2016-08-28 DIAGNOSIS — E1151 Type 2 diabetes mellitus with diabetic peripheral angiopathy without gangrene: Secondary | ICD-10-CM | POA: Diagnosis not present

## 2016-08-28 DIAGNOSIS — Z01118 Encounter for examination of ears and hearing with other abnormal findings: Secondary | ICD-10-CM | POA: Diagnosis not present

## 2016-08-28 DIAGNOSIS — E1140 Type 2 diabetes mellitus with diabetic neuropathy, unspecified: Secondary | ICD-10-CM | POA: Diagnosis not present

## 2016-10-17 DIAGNOSIS — N2581 Secondary hyperparathyroidism of renal origin: Secondary | ICD-10-CM | POA: Diagnosis not present

## 2016-10-17 DIAGNOSIS — E1122 Type 2 diabetes mellitus with diabetic chronic kidney disease: Secondary | ICD-10-CM | POA: Diagnosis not present

## 2016-10-17 DIAGNOSIS — N183 Chronic kidney disease, stage 3 (moderate): Secondary | ICD-10-CM | POA: Diagnosis not present

## 2016-10-17 DIAGNOSIS — R809 Proteinuria, unspecified: Secondary | ICD-10-CM | POA: Diagnosis not present

## 2016-10-17 DIAGNOSIS — R7989 Other specified abnormal findings of blood chemistry: Secondary | ICD-10-CM | POA: Diagnosis not present

## 2016-10-17 DIAGNOSIS — I129 Hypertensive chronic kidney disease with stage 1 through stage 4 chronic kidney disease, or unspecified chronic kidney disease: Secondary | ICD-10-CM | POA: Diagnosis not present

## 2016-11-05 IMAGING — DX DG WRIST COMPLETE 3+V*L*
4 series · 4 of 4 positions shown · non-contrast
Comparison: None.

CLINICAL DATA: 70-year-old female with acute left wrist pain and
swelling for 3 days. No known injury.

EXAM:
LEFT WRIST - COMPLETE 3+ VIEW

[x wrist pa left]
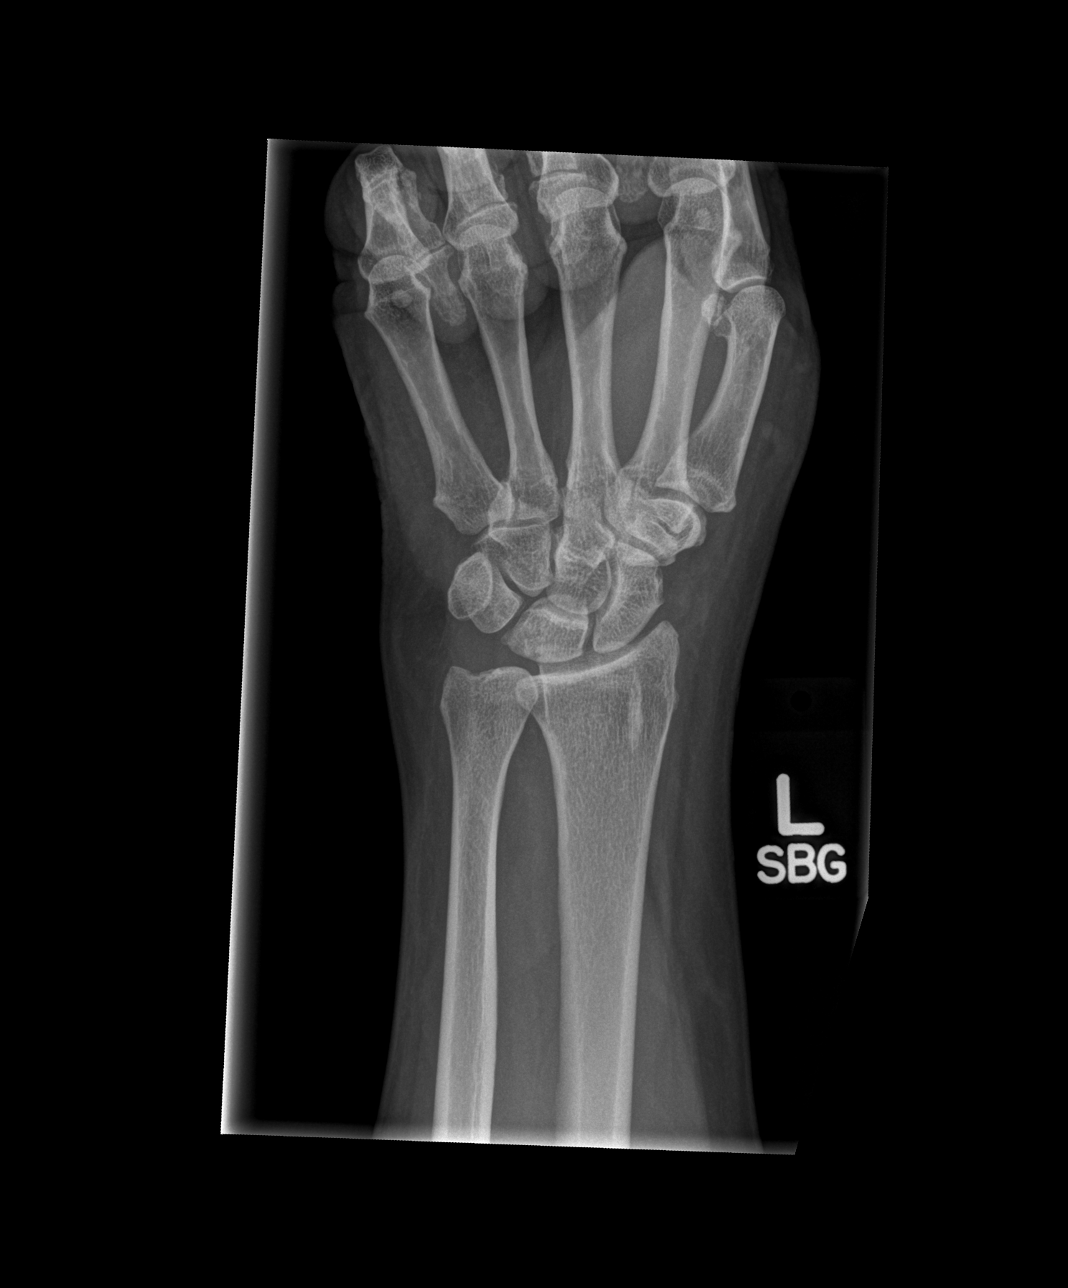

[x wrist obl left]
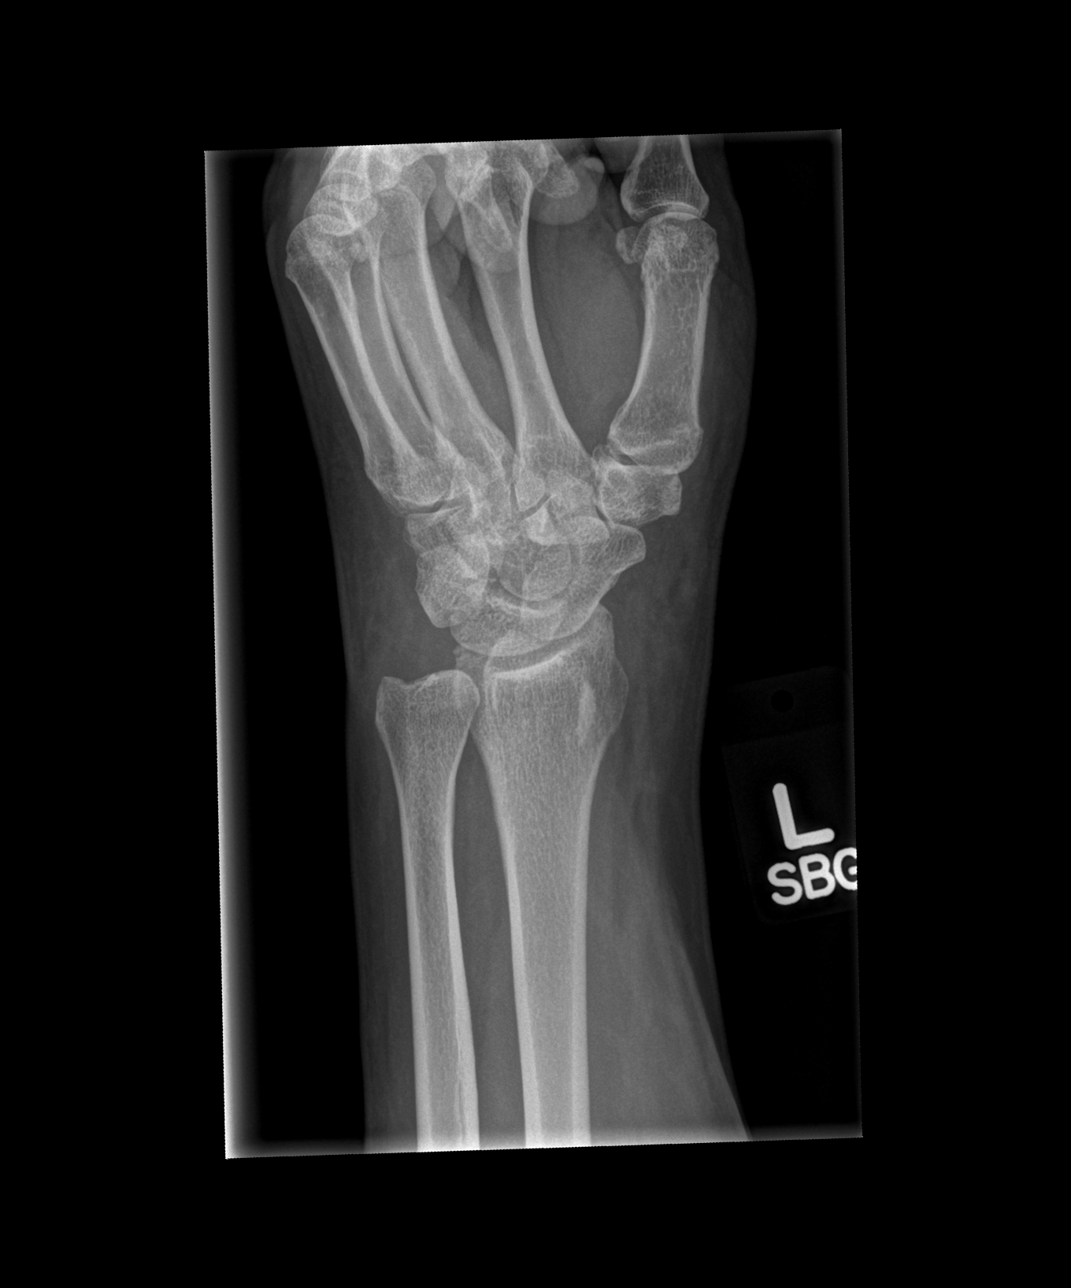

[x wrist lat left]
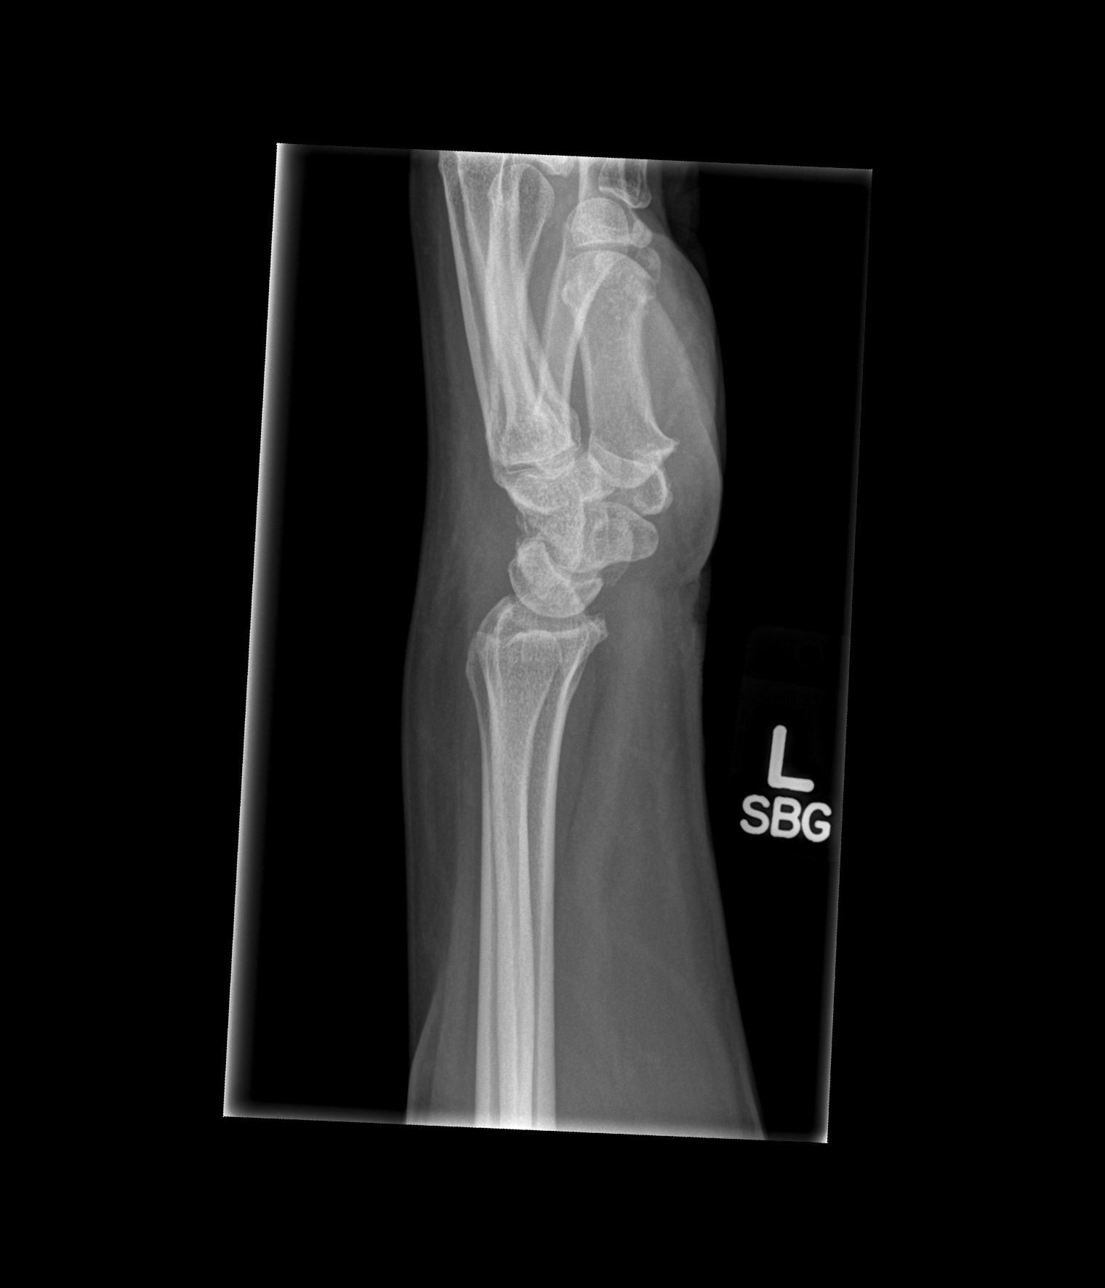

[x wrist navicular view left]
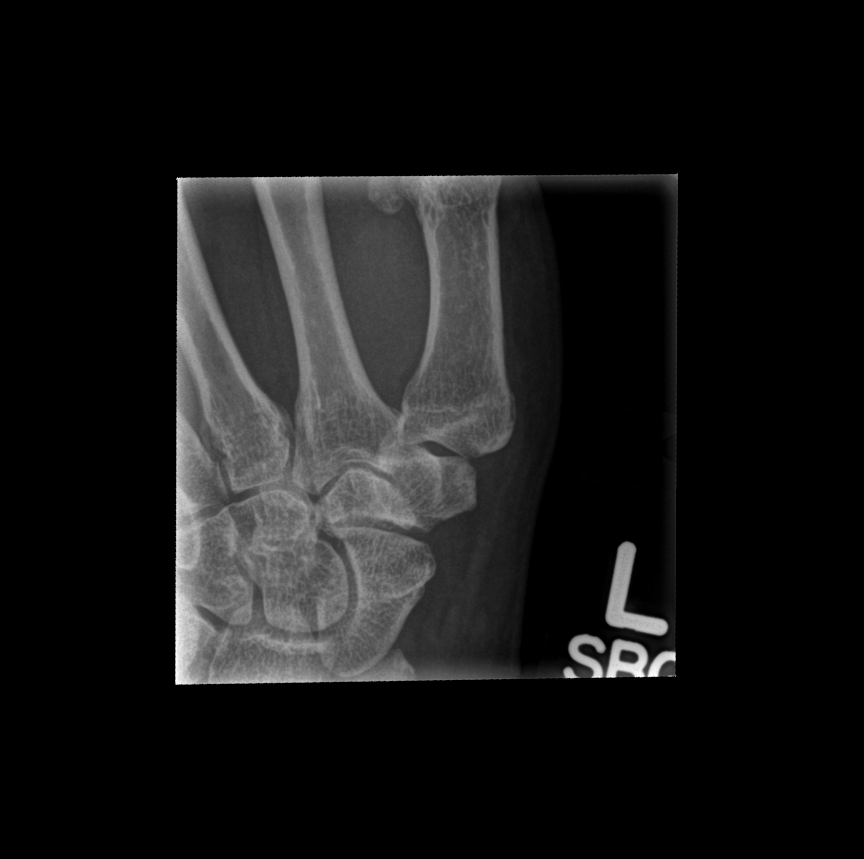

[4 of 4 positions shown; findings below may reference images not displayed]

FINDINGS: There is no evidence of acute fracture, subluxation or dislocation.

Soft tissue swelling is present.

No focal bony lesions are identified.

The joint spaces are within normal limits.
IMPRESSION: Soft tissue swelling without acute bony abnormality.

## 2016-11-28 DIAGNOSIS — E1151 Type 2 diabetes mellitus with diabetic peripheral angiopathy without gangrene: Secondary | ICD-10-CM | POA: Diagnosis not present

## 2016-11-28 DIAGNOSIS — I10 Essential (primary) hypertension: Secondary | ICD-10-CM | POA: Diagnosis not present

## 2016-11-28 DIAGNOSIS — N183 Chronic kidney disease, stage 3 (moderate): Secondary | ICD-10-CM | POA: Diagnosis not present

## 2016-11-28 DIAGNOSIS — E1140 Type 2 diabetes mellitus with diabetic neuropathy, unspecified: Secondary | ICD-10-CM | POA: Diagnosis not present

## 2016-11-28 DIAGNOSIS — E039 Hypothyroidism, unspecified: Secondary | ICD-10-CM | POA: Diagnosis not present

## 2016-12-21 DIAGNOSIS — Z1231 Encounter for screening mammogram for malignant neoplasm of breast: Secondary | ICD-10-CM | POA: Diagnosis not present

## 2017-01-03 DIAGNOSIS — Z780 Asymptomatic menopausal state: Secondary | ICD-10-CM | POA: Diagnosis not present

## 2017-02-20 DIAGNOSIS — N183 Chronic kidney disease, stage 3 (moderate): Secondary | ICD-10-CM | POA: Diagnosis not present

## 2017-02-20 DIAGNOSIS — E1151 Type 2 diabetes mellitus with diabetic peripheral angiopathy without gangrene: Secondary | ICD-10-CM | POA: Diagnosis not present

## 2017-02-20 DIAGNOSIS — Z0000 Encounter for general adult medical examination without abnormal findings: Secondary | ICD-10-CM | POA: Diagnosis not present

## 2017-02-20 DIAGNOSIS — Z5181 Encounter for therapeutic drug level monitoring: Secondary | ICD-10-CM | POA: Diagnosis not present

## 2017-02-20 DIAGNOSIS — H538 Other visual disturbances: Secondary | ICD-10-CM | POA: Diagnosis not present

## 2017-02-20 DIAGNOSIS — Z01118 Encounter for examination of ears and hearing with other abnormal findings: Secondary | ICD-10-CM | POA: Diagnosis not present

## 2017-04-24 DIAGNOSIS — N183 Chronic kidney disease, stage 3 (moderate): Secondary | ICD-10-CM | POA: Diagnosis not present

## 2017-04-24 DIAGNOSIS — E1140 Type 2 diabetes mellitus with diabetic neuropathy, unspecified: Secondary | ICD-10-CM | POA: Diagnosis not present

## 2017-04-24 DIAGNOSIS — I10 Essential (primary) hypertension: Secondary | ICD-10-CM | POA: Diagnosis not present

## 2017-04-24 DIAGNOSIS — E1151 Type 2 diabetes mellitus with diabetic peripheral angiopathy without gangrene: Secondary | ICD-10-CM | POA: Diagnosis not present

## 2017-05-02 DIAGNOSIS — M1612 Unilateral primary osteoarthritis, left hip: Secondary | ICD-10-CM | POA: Diagnosis not present

## 2017-05-02 DIAGNOSIS — M545 Low back pain: Secondary | ICD-10-CM | POA: Diagnosis not present

## 2017-05-02 DIAGNOSIS — M5136 Other intervertebral disc degeneration, lumbar region: Secondary | ICD-10-CM | POA: Diagnosis not present

## 2017-05-02 DIAGNOSIS — M1712 Unilateral primary osteoarthritis, left knee: Secondary | ICD-10-CM | POA: Diagnosis not present

## 2017-05-08 DIAGNOSIS — M545 Low back pain: Secondary | ICD-10-CM | POA: Diagnosis not present

## 2017-05-14 DIAGNOSIS — E1151 Type 2 diabetes mellitus with diabetic peripheral angiopathy without gangrene: Secondary | ICD-10-CM | POA: Diagnosis not present

## 2017-05-14 DIAGNOSIS — N183 Chronic kidney disease, stage 3 (moderate): Secondary | ICD-10-CM | POA: Diagnosis not present

## 2017-05-14 DIAGNOSIS — E039 Hypothyroidism, unspecified: Secondary | ICD-10-CM | POA: Diagnosis not present

## 2017-05-14 DIAGNOSIS — I10 Essential (primary) hypertension: Secondary | ICD-10-CM | POA: Diagnosis not present

## 2017-05-14 DIAGNOSIS — E1140 Type 2 diabetes mellitus with diabetic neuropathy, unspecified: Secondary | ICD-10-CM | POA: Diagnosis not present

## 2017-05-16 DIAGNOSIS — M5136 Other intervertebral disc degeneration, lumbar region: Secondary | ICD-10-CM | POA: Diagnosis not present

## 2017-05-17 DIAGNOSIS — E039 Hypothyroidism, unspecified: Secondary | ICD-10-CM | POA: Diagnosis not present

## 2017-05-17 DIAGNOSIS — E1140 Type 2 diabetes mellitus with diabetic neuropathy, unspecified: Secondary | ICD-10-CM | POA: Diagnosis not present

## 2017-05-17 DIAGNOSIS — N183 Chronic kidney disease, stage 3 (moderate): Secondary | ICD-10-CM | POA: Diagnosis not present

## 2017-05-17 DIAGNOSIS — I10 Essential (primary) hypertension: Secondary | ICD-10-CM | POA: Diagnosis not present

## 2017-05-17 DIAGNOSIS — E1151 Type 2 diabetes mellitus with diabetic peripheral angiopathy without gangrene: Secondary | ICD-10-CM | POA: Diagnosis not present

## 2017-05-23 ENCOUNTER — Ambulatory Visit: Payer: Medicare Other | Attending: Sports Medicine | Admitting: Physical Therapy

## 2017-05-23 DIAGNOSIS — M6281 Muscle weakness (generalized): Secondary | ICD-10-CM | POA: Diagnosis not present

## 2017-05-23 DIAGNOSIS — M545 Low back pain, unspecified: Secondary | ICD-10-CM

## 2017-05-23 DIAGNOSIS — G8929 Other chronic pain: Secondary | ICD-10-CM | POA: Diagnosis not present

## 2017-05-23 DIAGNOSIS — M62830 Muscle spasm of back: Secondary | ICD-10-CM | POA: Diagnosis not present

## 2017-05-24 ENCOUNTER — Ambulatory Visit: Payer: Medicare Other | Admitting: Physical Therapy

## 2017-05-24 ENCOUNTER — Telehealth: Payer: Self-pay | Admitting: Physical Therapy

## 2017-05-24 NOTE — Telephone Encounter (Signed)
Left message on voicemail about missed visit. Informed her of her next appointment and to please call to cancel if she is unable to attend.  Office phone number given.   Melvenia Needles PTA

## 2017-05-24 NOTE — Therapy (Signed)
[place], [place], [address] Phone: [phone]   Fax:  [phone]  Physical Therapy Evaluation  Patient Details  Name: Vanessa Morrow MRN: [id number] Date of Birth: 19-Jul-1946 Referring Provider: Dr Wandra Feinstein   Encounter Date: 05/23/2017      PT End of Session - 05/24/17 1310    Visit Number 1   Number of Visits 16   Date for PT Re-Evaluation 07/19/17   Authorization Type UHC medicare    PT Start Time 4481   PT Stop Time 1500   PT Time Calculation (min) 45 min   Activity Tolerance Patient tolerated treatment well   Behavior During Therapy [facility] for tasks assessed/performed      Past Medical History:  Diagnosis Date  . DM (diabetes mellitus) ([place])   . GERD (gastroesophageal reflux disease)   . Hypertension     No past surgical history on file.  There were no vitals filed for this visit.       Subjective Assessment - 05/23/17 1423    Subjective Patinet has a history of lower back pain. The pain in the past has went away. For the past 2 months the pain has not gone away. The pain radiates into her left hip. The pain is owrse when she is standing. It effects her ability to stand and perfrom activity.    Limitations Standing   How long can you sit comfortably? No limit    How long can you stand comfortably? < 10 minutes    How long can you walk comfortably? limited community distances with pain    Diagnostic tests MRI taken but not in her chart    Patient Stated Goals to have less pain    Currently in Pain? Yes   Pain Score 4   Pain can reach higher    Pain Location Back   Pain Orientation Left   Pain Descriptors / Indicators Aching   Pain Radiating Towards radiating into her gultes and at times into the back of her legs    Pain Onset More than a month ago   Pain Frequency Constant   Aggravating Factors  Standing makes the back worse    Pain Relieving Factors Nothing makes the back  better    Effect of Pain on Daily Activities Difficulty performing daily tasks.             [facility] PT Assessment - 05/24/17 0001      Assessment   Medical Diagnosis Low back pain    Referring Provider Dr Wandra Feinstein    Onset Date/Surgical Date --  2 months prior    Hand Dominance Right   Next MD Visit 1 month    Prior Therapy None      Precautions   Precautions None     Restrictions   Weight Bearing Restrictions No     Balance Screen   Has the patient fallen in the past 6 months No   Has the patient had a decrease in activity level because of a fear of falling?  No   Is the patient reluctant to leave their home because of a fear of falling?  No     Home Environment   Additional Comments Nothing pertinant      Prior Function   Level of Independence Independent   Vocation Retired   Leisure Working in the yard; IT consultant   Overall Cognitive Status Within Functional Limits for  tasks assessed   Attention Focused   Focused Attention Appears intact   Memory Appears intact   Awareness Appears intact   Problem Solving Appears intact     Observation/Other Assessments   Focus on Therapeutic Outcomes (FOTO)  41 % limitation      Sensation   Light Touch Appears Intact   Additional Comments Pain radiates into her left butock at times      Coordination   Gross Motor Movements are Fluid and Coordinated Yes   Fine Motor Movements are Fluid and Coordinated Yes     ROM / Strength   AROM / PROM / Strength AROM;PROM;Strength     AROM   AROM Assessment Site Lumbar   Lumbar Flexion 50% limited    Lumbar Extension 25% limited with pain    Lumbar - Right Side Bend no limit    Lumbar - Left Side Bend pain but no motion limitation    Lumbar - Right Rotation Pain on the left with right rotation    Lumbar - Left Rotation 25% limitation      PROM   Overall PROM Comments Pain with 95 degrees of paaaive left flexion; Pain with IR    PROM Assessment Site Hip    Right/Left Hip Left     Strength   Overall Strength Comments fair core contraction    Strength Assessment Site Hip   Right/Left Hip Left   Left Hip Flexion 4/5   Left Hip Extension 4/5   Left Hip ABduction 4/5     Flexibility   Soft Tissue Assessment /Muscle Length yes   Hamstrings 35 degree limitation bilateral      Palpation   Spinal mobility Limited PA motion for L4 to S1    SI assessment  Limited SI movement    Palpation comment Sigfniciant spamsing of the lumbar spine bilateral      Special Tests    Special Tests --  SLR (-) bilateral      Ambulation/Gait   Gait Comments Right knee instability with ambulation. Hisotry of left knee pain. decreased hip flexion on the right.             Objective measurements completed on examination: See above findings.          [facility] Adult PT Treatment/Exercise - 05/24/17 0001      Lumbar Exercises: Stretches   Lower Trunk Rotation Limitations x10 bilateral with cuing    Piriformis Stretch Limitations 2x30sec hold      Lumbar Exercises: Supine   AB Set Limitations PPT x10                 PT Education - 05/24/17 1307    Education provided Yes   Education Details reviewed HEP; improtance of strengthening; use of tennis ball for soft tissue mobilization    Person(s) Educated Patient   Methods Explanation;Demonstration;Tactile cues;Verbal cues   Comprehension Returned demonstration;Verbalized understanding;Verbal cues required;Tactile cues required          PT Short Term Goals - 05/24/17 1319      PT SHORT TERM GOAL #1   Title Patient will demsotrate a good core contraction    Time 4   Period Weeks   Status New     PT SHORT TERM GOAL #2   Title Patient will demsotrate full passive left hip mobility without pain    Time 4   Period Weeks   Status New     PT SHORT TERM GOAL #3   Title  Patient will demonstrate 4+/5 gross bilateral lower extremity strength    Time 4   Period Weeks   Status New            PT Long Term Goals - 05/24/17 1321      PT LONG TERM GOAL #1   Title Patient will return to the gym with a program that promotes core strength and protects the lumbar spine    Time 8   Period Weeks   Status New     PT LONG TERM GOAL #2   Title Patient will demsotrate full lumbar flexion in order to bend over to pick items off the ground    Time 8   Period Weeks   Status New     PT LONG TERM GOAL #3   Title Patient will demostrate a 39% limitation on FOTO    Time 8   Period Weeks   Status New                Plan - 05/24/17 1312    Clinical Impression Statement Patient is a 71 year old female who presents with lower back pain that radiates into her left hip. She has significant spasming of her bilateral paraspinals and limited PA motion of her lumbar spine. She has limited lumbar movement. She has limited core strength. She also has limitations in hip IR and flexion. she would benefit from skilled therapy to improve spinal movement as well as core strength. She has instability in both knees with gait although her knee strength is good. She was seen for a low complexity evalauation.    History and Personal Factors relevant to plan of care: DMII   Clinical Presentation Evolving   Clinical Decision Making Low   Rehab Potential Good   PT Frequency 2x / week   PT Duration 8 weeks   PT Treatment/Interventions ADLs/Self Care Home Management;Cryotherapy;Electrical Stimulation;Iontophoresis 4mg /ml Dexamethasone;Stair training;Gait training;Moist Heat;Traction;Ultrasound;Therapeutic exercise;Therapeutic activities;Neuromuscular re-education;Patient/family education;Splinting;Taping;Passive range of motion   PT Next Visit Plan review HEP; begin light core strengthening exercises. PA mobilizations of the lumbar spine to improve movement; manual therapy to improve motion of the left hip; Felt improved pain with left LAD.    PT Home Exercise Plan LTR, PPT, pirifromis stretch     Consulted and Agree with Plan of Care Patient      Patient will benefit from skilled therapeutic intervention in order to improve the following deficits and impairments:  Abnormal gait, Pain, Decreased strength, Decreased mobility, Increased muscle spasms, Decreased activity tolerance  Visit Diagnosis: Chronic left-sided low back pain without sciatica - Plan: PT plan of care cert/re-cert  Muscle spasm of back - Plan: PT plan of care cert/re-cert  Muscle weakness (generalized) - Plan: PT plan of care cert/re-cert      G-Codes - 09/98/33 1324    Functional Assessment Tool Used (Outpatient Only) FOTO, clinical decision making    Functional Limitation Changing and maintaining body position   Changing and Maintaining Body Position Current Status (A2505) At least 40 percent but less than 60 percent impaired, limited or restricted   Changing and Maintaining Body Position Goal Status (L9767) At least 20 percent but less than 40 percent impaired, limited or restricted       Problem List There are no active problems to display for this patient.   Carney Living PT DPT  05/24/2017, 1:39 PM  [facility] [address] [place], [place], [address] Phone: [phone]   Fax:  [phone]  Name: ANNIKA SELKE MRN: [id number] Date of Birth: 17-May-1946

## 2017-05-31 DIAGNOSIS — M5416 Radiculopathy, lumbar region: Secondary | ICD-10-CM | POA: Diagnosis not present

## 2017-05-31 DIAGNOSIS — M545 Low back pain: Secondary | ICD-10-CM | POA: Diagnosis not present

## 2017-05-31 DIAGNOSIS — M5136 Other intervertebral disc degeneration, lumbar region: Secondary | ICD-10-CM | POA: Diagnosis not present

## 2017-06-07 ENCOUNTER — Encounter: Payer: Medicare Other | Admitting: Physical Therapy

## 2017-06-12 ENCOUNTER — Ambulatory Visit: Payer: Medicare Other | Attending: Sports Medicine | Admitting: Physical Therapy

## 2017-06-12 ENCOUNTER — Encounter: Payer: Self-pay | Admitting: Physical Therapy

## 2017-06-12 DIAGNOSIS — M6281 Muscle weakness (generalized): Secondary | ICD-10-CM | POA: Diagnosis not present

## 2017-06-12 DIAGNOSIS — G8929 Other chronic pain: Secondary | ICD-10-CM | POA: Diagnosis not present

## 2017-06-12 DIAGNOSIS — M62830 Muscle spasm of back: Secondary | ICD-10-CM | POA: Diagnosis not present

## 2017-06-12 DIAGNOSIS — M545 Low back pain: Secondary | ICD-10-CM | POA: Insufficient documentation

## 2017-06-13 NOTE — Therapy (Signed)
[place], [place], [address] Phone: [phone]   Fax:  [phone]  Physical Therapy Treatment  Patient Details  Name: Vanessa Morrow MRN: [id number] Date of Birth: 08/26/1946 Referring Provider: Dr Wandra Feinstein   Encounter Date: 06/12/2017      PT End of Session - 06/13/17 0808    Visit Number 2   Number of Visits 16   Date for PT Re-Evaluation 07/19/17   Authorization Type UHC medicare    PT Start Time [phone]   PT Stop Time 1012   PT Time Calculation (min) 41 min   Activity Tolerance Patient tolerated treatment well   Behavior During Therapy [facility] for tasks assessed/performed      Past Medical History:  Diagnosis Date  . DM (diabetes mellitus) ([place])   . GERD (gastroesophageal reflux disease)   . Hypertension     No past surgical history on file.  There were no vitals filed for this visit.      Subjective Assessment - 06/12/17 0938    Subjective Patient reports no pain in her back at this time but she feels like the pain has moved to her hip. She feels like the exercises may be making it worse but she can not pin point which ones. She only has three exercises.    Limitations Standing   How long can you sit comfortably? No limit    How long can you stand comfortably? < 10 minutes    How long can you walk comfortably? limited community distances with pain    Diagnostic tests MRI taken but not in her chart    Patient Stated Goals to have less pain    Currently in Pain? No/denies                         [facility] Adult PT Treatment/Exercise - 06/13/17 0001      Lumbar Exercises: Stretches   Lower Trunk Rotation Limitations x10 bilateral with cuing    Piriformis Stretch Limitations 2x30sec hold      Lumbar Exercises: Supine   AB Set Limitations PPT x10    Clam Limitations 2x10 with yellow band and cuing for abdominal bracing    Bent Knee Raise Limitations 2x10   Other Supine  Lumbar Exercises ball squeeze 2x10      Manual Therapy   Manual Therapy Soft tissue mobilization;Manual Traction   Soft tissue mobilization to glut medius and left quadratus   Manual Traction LAD left hip to reduce pressure on the back and hip                 PT Education - 06/12/17 0939    Education provided Yes   Education Details continue with HEP    Person(s) Educated Patient   Methods Explanation;Demonstration;Tactile cues;Verbal cues   Comprehension Verbalized understanding;Verbal cues required;Returned demonstration          PT Short Term Goals - 06/13/17 0854      PT SHORT TERM GOAL #1   Title Patient will demsotrate a good core contraction    Time 4   Period Weeks   Status On-going     PT SHORT TERM GOAL #2   Title Patient will demsotrate full passive left hip mobility without pain    Time 4   Period Weeks   Status On-going     PT SHORT TERM GOAL #3   Title Patient will demonstrate 4+/5 gross bilateral lower  extremity strength    Time 4   Period Weeks   Status On-going           PT Long Term Goals - 05/24/17 1321      PT LONG TERM GOAL #1   Title Patient will return to the gym with a program that promotes core strength and protects the lumbar spine    Time 8   Period Weeks   Status New     PT LONG TERM GOAL #2   Title Patient will demsotrate full lumbar flexion in order to bend over to pick items off the ground    Time 8   Period Weeks   Status New     PT LONG TERM GOAL #3   Title Patient will demostrate a 39% limitation on FOTO    Time 8   Period Weeks   Status New               Plan - 06/13/17 6767    Clinical Impression Statement Patient tolerated treatment well. She had moderate tenderness of palpation in the glut medius area and up into her quadratus. She reports the pain is in her hip but appears to be more in her lower back per palpatio. She was given light strengthening exercises for her core and hip. She is making  progress.     Clinical Presentation Evolving   Clinical Decision Making Low   Rehab Potential Good   PT Frequency 2x / week   PT Duration 8 weeks   PT Treatment/Interventions ADLs/Self Care Home Management;Cryotherapy;Electrical Stimulation;Iontophoresis 4mg /ml Dexamethasone;Stair training;Gait training;Moist Heat;Traction;Ultrasound;Therapeutic exercise;Therapeutic activities;Neuromuscular re-education;Patient/family education;Splinting;Taping;Passive range of motion   PT Next Visit Plan review HEP; begin light core strengthening exercises. PA mobilizations of the lumbar spine to improve movement; manual therapy to improve motion of the left hip; Felt improved pain with left LAD.    PT Home Exercise Plan LTR, PPT, pirifromis stretch    Consulted and Agree with Plan of Care Patient      Patient will benefit from skilled therapeutic intervention in order to improve the following deficits and impairments:  Abnormal gait, Pain, Decreased strength, Decreased mobility, Increased muscle spasms, Decreased activity tolerance  Visit Diagnosis: Chronic left-sided low back pain without sciatica  Muscle spasm of back  Muscle weakness (generalized)     Problem List There are no active problems to display for this patient.   Carney Living PT DPT  06/13/2017, 9:19 AM  [facility] [address] [place], [place], [address] Phone: [phone]   Fax:  [phone]  Name: Vanessa Morrow MRN: [id number] Date of Birth: [birth year]

## 2017-06-14 ENCOUNTER — Ambulatory Visit: Payer: Medicare Other | Admitting: Physical Therapy

## 2017-06-14 DIAGNOSIS — M545 Low back pain, unspecified: Secondary | ICD-10-CM

## 2017-06-14 DIAGNOSIS — M6281 Muscle weakness (generalized): Secondary | ICD-10-CM

## 2017-06-14 DIAGNOSIS — G8929 Other chronic pain: Secondary | ICD-10-CM

## 2017-06-14 DIAGNOSIS — M62830 Muscle spasm of back: Secondary | ICD-10-CM

## 2017-06-14 NOTE — Therapy (Signed)
[place], [place], [address] Phone: [phone]   Fax:  [phone]  Physical Therapy Treatment  Patient Details  Name: Vanessa Morrow MRN: [id number] Date of Birth: [birth year] Referring Provider: Dr Vanessa Morrow   Encounter Date: 06/14/2017      PT End of Session - 06/14/17 0849    Visit Number 3   Number of Visits 16   Date for PT Re-Evaluation 07/19/17   Authorization Type UHC medicare    PT Start Time 0845   PT Stop Time 0926   PT Time Calculation (min) 41 min   Activity Tolerance Patient tolerated treatment well   Behavior During Therapy [facility] for tasks assessed/performed      Past Medical History:  Diagnosis Date  . DM (diabetes mellitus) ([place])   . GERD (gastroesophageal reflux disease)   . Hypertension     No past surgical history on file.  There were no vitals filed for this visit.      Subjective Assessment - 06/14/17 0847    Subjective Patient reports she has not had much pain since the last visit. She was able to go to the gym yesterday without any pain.    Limitations Standing   How long can you sit comfortably? No limit    How long can you stand comfortably? < 10 minutes    How long can you walk comfortably? limited community distances with pain    Diagnostic tests MRI taken but not in her chart    Patient Stated Goals to have less pain    Currently in Pain? No/denies                         [facility] Adult PT Treatment/Exercise - 06/14/17 0001      Lumbar Exercises: Stretches   Passive Hamstring Stretch Limitations 2x30 sec    Lower Trunk Rotation Limitations x10 bilateral with cuing    Piriformis Stretch Limitations 2x30sec hold      Lumbar Exercises: Supine   AB Set Limitations PPT x10    Clam Limitations 2x10 with yellow band and cuing for abdominal bracing    Bent Knee Raise Limitations 2x10   Bridge Limitations x10 x5    Other Supine Lumbar  Exercises ball squeeze 2x10      Manual Therapy   Manual Therapy Soft tissue mobilization;Manual Traction   Soft tissue mobilization to glut medius and left quadratus   Manual Traction LAD left hip to reduce pressure on the back and hip                 PT Education - 06/14/17 0848    Education provided Yes   Education Details reivewed technique with HEP    Person(s) Educated Patient   Methods Explanation;Demonstration;Tactile cues;Verbal cues   Comprehension Verbalized understanding;Returned demonstration;Verbal cues required          PT Short Term Goals - 06/14/17 1616      PT SHORT TERM GOAL #1   Title Patient will demsotrate a good core contraction    Baseline good with cuing    Time 4   Period Weeks   Status Partially Met     PT SHORT TERM GOAL #2   Title Patient will demsotrate full passive left hip mobility without pain    Baseline No pain today    Time 4   Period Weeks   Status Achieved     PT SHORT TERM  GOAL #3   Title Patient will demonstrate 4+/5 gross bilateral lower extremity strength    Baseline working on stregnthening    Time 4   Period Weeks   Status On-going           PT Long Term Goals - 05/24/17 1321      PT LONG TERM GOAL #1   Title Patient will return to the gym with a program that promotes core strength and protects the lumbar spine    Time 8   Period Weeks   Status New     PT LONG TERM GOAL #2   Title Patient will demsotrate full lumbar flexion in order to bend over to pick items off the ground    Time 8   Period Weeks   Status New     PT LONG TERM GOAL #3   Title Patient will demostrate a 39% limitation on FOTO    Time [place] - 06/14/17 1613    Clinical Impression Statement Patient had no pain today., therapy advanced her exercises. She had no pain after treatment. Decreased spasming noted. See below for goal specific progress.    Clinical Presentation Evolving    PT Frequency 2x / week   PT Duration 8 weeks   PT Treatment/Interventions ADLs/Self Care Home Management;Cryotherapy;Electrical Stimulation;Iontophoresis 33m/ml Dexamethasone;Stair training;Gait training;Moist Heat;Traction;Ultrasound;Therapeutic exercise;Therapeutic activities;Neuromuscular re-education;Patient/family education;Splinting;Taping;Passive range of motion   PT Next Visit Plan review HEP; begin light core strengthening exercises. PA mobilizations of the lumbar spine to improve movement; manual therapy to improve motion of the left hip; Felt improved pain with left LAD.    PT Home Exercise Plan LTR, PPT, pirifromis stretch    Consulted and Agree with Plan of Care Patient      Patient will benefit from skilled therapeutic intervention in order to improve the following deficits and impairments:  Abnormal gait, Pain, Decreased strength, Decreased mobility, Increased muscle spasms, Decreased activity tolerance  Visit Diagnosis: Chronic left-sided low back pain without sciatica  Muscle spasm of back  Muscle weakness (generalized)     Problem List There are no active problems to display for this patient.   DCarney LivingPT DPT  06/14/2017, 4:19 PM  [facility] [address]Phone: [phone]  Fax:  [phone] Name: MMADOLINE BHATTMRN: [id number] of Birth: 107/22/1947

## 2017-06-19 ENCOUNTER — Encounter: Payer: Self-pay | Admitting: Physical Therapy

## 2017-06-19 ENCOUNTER — Ambulatory Visit: Payer: Medicare Other | Admitting: Physical Therapy

## 2017-06-19 DIAGNOSIS — M545 Low back pain, unspecified: Secondary | ICD-10-CM

## 2017-06-19 DIAGNOSIS — M6281 Muscle weakness (generalized): Secondary | ICD-10-CM | POA: Diagnosis not present

## 2017-06-19 DIAGNOSIS — M62830 Muscle spasm of back: Secondary | ICD-10-CM | POA: Diagnosis not present

## 2017-06-19 DIAGNOSIS — G8929 Other chronic pain: Secondary | ICD-10-CM | POA: Diagnosis not present

## 2017-06-19 NOTE — Therapy (Signed)
[place], [place], [address] Phone: [phone]   Fax:  [phone]  Physical Therapy Treatment  Patient Details  Name: Vanessa Morrow MRN: [id number] Date of Birth: [birth year] Referring Provider: Dr Wandra Feinstein   Encounter Date: 06/19/2017      PT End of Session - 06/19/17 0942    Visit Number 4   Number of Visits 16   Date for PT Re-Evaluation 07/19/17   Authorization Type [employer] medicare    PT Start Time 0933   PT Stop Time 1015   PT Time Calculation (min) 42 min   Activity Tolerance Patient tolerated treatment well   Behavior During Therapy [facility] for tasks assessed/performed      Past Medical History:  Diagnosis Date  . DM (diabetes mellitus) ([place])   . GERD (gastroesophageal reflux disease)   . Hypertension     History reviewed. No pertinent surgical history.  There were no vitals filed for this visit.      Subjective Assessment - 06/19/17 0940    Subjective Patient reports that her back has been feeling good ver the past few days. She is mostly having pain when she stands for too long. She feels pain when she is doing dishes. She has beendoing her ezercises and going to the gym.    Limitations Standing   How long can you sit comfortably? No limit    How long can you stand comfortably? < 10 minutes    How long can you walk comfortably? limited community distances with pain    Diagnostic tests MRI taken but not in her chart    Patient Stated Goals to have less pain    Currently in Pain? No/denies                         [facility] Adult PT Treatment/Exercise - 06/19/17 0001      Lumbar Exercises: Stretches   Passive Hamstring Stretch Limitations 2x30 sec 90/90    Lower Trunk Rotation Limitations x10 bilateral with cuing    Piriformis Stretch Limitations 2x30sec hold      Lumbar Exercises: Supine   Clam Limitations 2x10 with yellow band and cuing for abdominal bracing    Bent Knee Raise Limitations 2x10   Bridge Limitations 2x10    Straight Leg Raises Limitations 2x10    Other Supine Lumbar Exercises ball squeeze 2x10      Manual Therapy   Manual Therapy Soft tissue mobilization;Manual Traction;Joint mobilization   Manual therapy comments Grade II and II PA glides form L3- L5   Joint Mobilization Grade II and III PA glides    Soft tissue mobilization to glut medius and left quadratus   Manual Traction LAD left hip to reduce pressure on the back and hip                 PT Education - 06/19/17 0941    Education provided Yes   Education Details reviewed HEP    Person(s) Educated Patient   Methods Explanation;Demonstration;Tactile cues;Verbal cues   Comprehension Verbalized understanding;Returned demonstration;Verbal cues required          PT Short Term Goals - 06/14/17 1616      PT SHORT TERM GOAL #1   Title Patient will demsotrate a good core contraction    Baseline good with cuing    Time 4   Period Weeks   Status Partially Met     PT SHORT TERM  GOAL #2   Title Patient will demsotrate full passive left hip mobility without pain    Baseline No pain today    Time 4   Period Weeks   Status Achieved     PT SHORT TERM GOAL #3   Title Patient will demonstrate 4+/5 gross bilateral lower extremity strength    Baseline working on stregnthening    Time 4   Period Weeks   Status On-going           PT Long Term Goals - 05/24/17 1321      PT LONG TERM GOAL #1   Title Patient will return to the gym with a program that promotes core strength and protects the lumbar spine    Time 8   Period Weeks   Status New     PT LONG TERM GOAL #2   Title Patient will demsotrate full lumbar flexion in order to bend over to pick items off the ground    Time 8   Period Weeks   Status New     PT LONG TERM GOAL #3   Title Patient will demostrate a 39% limitation on FOTO    Time 8   Period Weeks   Status New               Plan -  06/19/17 1133    Clinical Impression Statement Patient continues to make good progress. She reported increased pain with standing. This is likley a core strength issue but she has stiffness in her lumbar spine. Therapy worked on gently PA Mobilization of the lumbar spine to geve her an improvedability to stand.    History and Personal Factors relevant to plan of care: DMII   Clinical Presentation Evolving   Rehab Potential Good   PT Frequency 2x / week   PT Duration 8 weeks   PT Treatment/Interventions ADLs/Self Care Home Management;Cryotherapy;Electrical Stimulation;Iontophoresis 38m/ml Dexamethasone;Stair training;Gait training;Moist Heat;Traction;Ultrasound;Therapeutic exercise;Therapeutic activities;Neuromuscular re-education;Patient/family education;Splinting;Taping;Passive range of motion   PT Next Visit Plan review HEP; begin light core strengthening exercises. PA mobilizations of the lumbar spine to improve movement; manual therapy to improve motion of the left hip; Felt improved pain with left LAD.    PT Home Exercise Plan LTR, PPT, pirifromis stretch    Consulted and Agree with Plan of Care Patient      Patient will benefit from skilled therapeutic intervention in order to improve the following deficits and impairments:  Abnormal gait, Pain, Decreased strength, Decreased mobility, Increased muscle spasms, Decreased activity tolerance  Visit Diagnosis: Chronic left-sided low back pain without sciatica  Muscle spasm of back  Muscle weakness (generalized)     Problem List There are no active problems to display for this patient.   DCarney LivingPT DPT  06/19/2017, 1:26 PM  [facility] [address]Phone: [phone]  Fax:  [phone] Name: MLAUREEN FREDERICMRN: [id number] of Birth: [birth year]

## 2017-06-21 ENCOUNTER — Encounter: Payer: Self-pay | Admitting: Physical Therapy

## 2017-06-21 ENCOUNTER — Ambulatory Visit: Payer: Medicare Other | Admitting: Physical Therapy

## 2017-06-21 DIAGNOSIS — G8929 Other chronic pain: Secondary | ICD-10-CM | POA: Diagnosis not present

## 2017-06-21 DIAGNOSIS — M6281 Muscle weakness (generalized): Secondary | ICD-10-CM | POA: Diagnosis not present

## 2017-06-21 DIAGNOSIS — M545 Low back pain, unspecified: Secondary | ICD-10-CM

## 2017-06-21 DIAGNOSIS — M62830 Muscle spasm of back: Secondary | ICD-10-CM | POA: Diagnosis not present

## 2017-06-21 NOTE — Therapy (Signed)
[place], [place], [address] Phone: [phone]   Fax:  [phone]  Physical Therapy Treatment  Patient Details  Name: Vanessa Morrow MRN: [id number] Date of Birth: 01/14/1946 Referring Provider: Dr Wandra Feinstein   Encounter Date: 06/21/2017      PT End of Session - 06/21/17 0943    Visit Number 5   Number of Visits 16   Date for PT Re-Evaluation 07/19/17   Authorization Type [employer]HC medicare    PT Start Time [phone]   PT Stop Time 1015   PT Time Calculation (min) 38 min   Activity Tolerance Patient tolerated treatment well   Behavior During Therapy [facility] for tasks assessed/performed      Past Medical History:  Diagnosis Date  . DM (diabetes mellitus) ([place])   . GERD (gastroesophageal reflux disease)   . Hypertension     History reviewed. No pertinent surgical history.  There were no vitals filed for this visit.      Subjective Assessment - 06/21/17 0944    Subjective Patient continues to report no pain. She was able to walk 4 miles on the tredmill yesterday then whnt home and cooked. She reported being able to stand longer at her kitchen counter while she cooked.    Limitations Standing   How long can you sit comfortably? No limit    How long can you stand comfortably? < 10 minutes    How long can you walk comfortably? limited community distances with pain    Diagnostic tests MRI taken but not in her chart    Patient Stated Goals to have less pain    Currently in Pain? No/denies                         [facility] Adult PT Treatment/Exercise - 06/21/17 0001      Lumbar Exercises: Stretches   Passive Hamstring Stretch Limitations 2x30 sec 90/90    Lower Trunk Rotation Limitations x10 bilateral with cuing    Piriformis Stretch Limitations 2x30sec hold      Lumbar Exercises: Supine   Clam Limitations 2x10 with yellow band and cuing for abdominal bracing    Bent Knee Raise  Limitations 2x10   Bridge Limitations 2x10    Straight Leg Raises Limitations 2x10    Other Supine Lumbar Exercises ball squeeze 2x10      Manual Therapy   Manual Therapy Soft tissue mobilization;Manual Traction;Joint mobilization   Manual therapy comments Grade II and II PA glides form L3- L5   Joint Mobilization Grade II and III PA glides    Soft tissue mobilization to glut medius and left quadratus   Manual Traction LAD left hip to reduce pressure on the back and hip                 PT Education - 06/21/17 0946    Education provided Yes   Education Details reviewed HEP    Person(s) Educated Patient   Methods Explanation;Demonstration;Tactile cues;Verbal cues   Comprehension Verbalized understanding;Returned demonstration;Verbal cues required          PT Short Term Goals - 06/21/17 1258      PT SHORT TERM GOAL #1   Title Patient will demsotrate a good core contraction    Baseline good with cuing    Time 4   Period Weeks   Status On-going     PT SHORT TERM GOAL #2   Title Patient will  demsotrate full passive left hip mobility without pain    Baseline No pain today    Time 4   Period Weeks   Status On-going     PT SHORT TERM GOAL #3   Title Patient will demonstrate 4+/5 gross bilateral lower extremity strength    Baseline working on stregnthening    Time 4   Period Weeks   Status On-going           PT Long Term Goals - 05/24/17 1321      PT LONG TERM GOAL #1   Title Patient will return to the gym with a program that promotes core strength and protects the lumbar spine    Time 8   Period Weeks   Status New     PT LONG TERM GOAL #2   Title Patient will demsotrate full lumbar flexion in order to bend over to pick items off the ground    Time 8   Period Weeks   Status New     PT LONG TERM GOAL #3   Title Patient will demostrate a 39% limitation on FOTO    Time 8   Period Weeks   Status New               Plan - 06/21/17 9675     Clinical Impression Statement Patient was seven minutes late for her appointment. She continues to tolerate exercises well. Therapy continues to work on lumbar mobilizations to improve her ability to stand. If he continues to progress well she will likley discharge next week.    Clinical Presentation Evolving   Clinical Decision Making Low   Rehab Potential Good   PT Frequency 2x / week   PT Duration 8 weeks   PT Treatment/Interventions ADLs/Self Care Home Management;Cryotherapy;Electrical Stimulation;Iontophoresis 4mg /ml Dexamethasone;Stair training;Gait training;Moist Heat;Traction;Ultrasound;Therapeutic exercise;Therapeutic activities;Neuromuscular re-education;Patient/family education;Splinting;Taping;Passive range of motion   PT Next Visit Plan review HEP; begin light core strengthening exercises. PA mobilizations of the lumbar spine to improve movement; manual therapy to improve motion of the left hip; Felt improved pain with left LAD.    PT Home Exercise Plan LTR, PPT, pirifromis stretch    Consulted and Agree with Plan of Care Patient      Patient will benefit from skilled therapeutic intervention in order to improve the following deficits and impairments:  Abnormal gait, Pain, Decreased strength, Decreased mobility, Increased muscle spasms, Decreased activity tolerance  Visit Diagnosis: Chronic left-sided low back pain without sciatica  Muscle spasm of back  Muscle weakness (generalized)     Problem List There are no active problems to display for this patient.   Carney Living PT DPT  06/21/2017, 12:58 PM  [facility] [address] [place], [place], [address] Phone: [phone]   Fax:  [phone]  Name: Vanessa Morrow MRN: [id number] Date of Birth: 29-Mar-1946

## 2017-06-26 ENCOUNTER — Ambulatory Visit: Payer: Medicare Other | Admitting: Physical Therapy

## 2017-06-26 ENCOUNTER — Encounter: Payer: Self-pay | Admitting: Physical Therapy

## 2017-06-26 DIAGNOSIS — M545 Low back pain, unspecified: Secondary | ICD-10-CM

## 2017-06-26 DIAGNOSIS — M62830 Muscle spasm of back: Secondary | ICD-10-CM | POA: Diagnosis not present

## 2017-06-26 DIAGNOSIS — G8929 Other chronic pain: Secondary | ICD-10-CM

## 2017-06-26 DIAGNOSIS — M6281 Muscle weakness (generalized): Secondary | ICD-10-CM | POA: Diagnosis not present

## 2017-06-26 NOTE — Therapy (Addendum)
[facility] [address] [place], [place], [address] Phone: [phone]   Fax:  [phone]  Physical Therapy Treatment/ Discharge  Patient Details  Name: Vanessa Morrow MRN: [id number] Date of Birth: 04/17/1946 Referring Provider: Dr Rebecca Bassett   Encounter Date: 06/26/2017      PT End of Session - 06/26/17 1016    Visit Number 6   Number of Visits 16   Date for PT Re-Evaluation 07/19/17   PT Start Time 0933   PT Stop Time 1015   PT Time Calculation (min) 42 min   Activity Tolerance Patient tolerated treatment well;No increased pain   Behavior During Therapy [facility] for tasks assessed/performed      Past Medical History:  Diagnosis Date  . DM (diabetes mellitus) ([facility])   . GERD (gastroesophageal reflux disease)   . Hypertension     History reviewed. No pertinent surgical history.  There were no vitals filed for this visit.      Subjective Assessment - 06/26/17 0935    Subjective No pain.  wants to go on hold 2 weeks prior to discharge.  She is compliant with exercises.    Currently in Pain? No/denies   Pain Location Back            [facility] PT Assessment - 06/26/17 0001      Observation/Other Assessments   Focus on Therapeutic Outcomes (FOTO)  40% limitation     AROM   Lumbar Flexion --  able to reach 1.5 inches from floor,  pain free.     Strength   Strength Assessment Site Hip   Right/Left Hip Left   Left Hip Flexion 4+/5   Left Hip Extension 4+/5  ROM  LT< right   Left Hip ABduction 5/5                     [facility] Adult PT Treatment/Exercise - 06/26/17 0001      Lumbar Exercises: Stretches   Passive Hamstring Stretch Limitations 3 X 30  90/90  both   Single Knee to Chest Stretch 2 reps;30 seconds  both   Lower Trunk Rotation Limitations 10 x   Pelvic Tilt 5 reps   Piriformis Stretch Limitations 3 X 30 each  PROM     Lumbar Exercises: Aerobic   Stationary Bike Nustep 5 minutes ,  L5 UE/LE     Lumbar Exercises: Standing   Row 15 reps   Theraband Level (Row) Level 4 (Blue)  cues initially   Shoulder Extension Limitations 15 X blue band 15 X cues for shoulders down etc.                  PT Short Term Goals - 06/26/17 0950      PT SHORT TERM GOAL #1   Title Patient will demsotrate a good core contraction    Baseline yes   Time 4   Period Weeks   Status Achieved     PT SHORT TERM GOAL #2   Title Patient will demsotrate full passive left hip mobility without pain    Baseline no pain , ROM mildyl limited   Time 4   Period Weeks   Status Partially Met     PT SHORT TERM GOAL #3   Title Patient will demonstrate 4+/5 gross bilateral lower extremity strength    Time 4   Period Weeks           PT Long Term Goals - 06/26/17 0952        PT LONG TERM GOAL #1   Title Patient will return to the gym with a program that promotes core strength and protects the lumbar spine    Baseline Patient has been to [employer] 3 x last week.  She is able to do the things she does here.  She works out about 2 hours  with her Daughter and  Sister.   Time 8   Period Weeks   Status Achieved     PT LONG TERM GOAL #2   Title Patient will demsotrate full lumbar flexion in order to bend over to pick items off the ground    Baseline able to reach 1.5 inches from floor without pain,  80 degrees   Time 8   Period Weeks   Status Achieved     PT LONG TERM GOAL #3   Title Patient will demostrate a 39% limitation on FOTO    Baseline 40 % limitation   Time 8   Period Weeks   Status Partially Met               Plan - 06/26/17 1024    Clinical Impression Statement Patrient wants to go on hold for 2 weeks.  Just to see if everything goes well.  She will call to let us know if she does not need to return.  She is happy with results.  FOTO  improved 1 point.  Goals met or partially met   PT Next Visit Plan Hold for 2 weeks at patient's request then DC.   PT Home  Exercise Plan LTR, PPT, pirifromis stretch    Consulted and Agree with Plan of Care Patient      Patient will benefit from skilled therapeutic intervention in order to improve the following deficits and impairments:     Visit Diagnosis: Chronic left-sided low back pain without sciatica  Muscle spasm of back  Muscle weakness (generalized)     Problem List There are no active problems to display for this patient.  PHYSICAL THERAPY DISCHARGE SUMMARY  Visits from Start of Care: 6  Current functional level related to goals / functional outcomes: Improved pain     Remaining deficits: None    Education / Equipment: HEP Plan: Patient agrees to discharge.  Patient goals were not met. Patient is being discharged due to meeting the stated rehab goals.  ?????      Vanessa Morrow PTA 06/26/2017, 5:12 PM  [facility] [address] [place], [place], [address] Phone: [phone]   Fax:  [phone]  Name: Vanessa Morrow MRN: [id number] Date of Birth: 1946/05/30

## 2017-06-28 ENCOUNTER — Ambulatory Visit: Payer: Medicare Other | Admitting: Physical Therapy

## 2017-08-29 DIAGNOSIS — E039 Hypothyroidism, unspecified: Secondary | ICD-10-CM | POA: Diagnosis not present

## 2017-08-29 DIAGNOSIS — I10 Essential (primary) hypertension: Secondary | ICD-10-CM | POA: Diagnosis not present

## 2017-08-29 DIAGNOSIS — Z23 Encounter for immunization: Secondary | ICD-10-CM | POA: Diagnosis not present

## 2017-08-29 DIAGNOSIS — E1140 Type 2 diabetes mellitus with diabetic neuropathy, unspecified: Secondary | ICD-10-CM | POA: Diagnosis not present

## 2017-08-29 DIAGNOSIS — Z0000 Encounter for general adult medical examination without abnormal findings: Secondary | ICD-10-CM | POA: Diagnosis not present

## 2017-08-29 DIAGNOSIS — E1151 Type 2 diabetes mellitus with diabetic peripheral angiopathy without gangrene: Secondary | ICD-10-CM | POA: Diagnosis not present

## 2017-09-06 DIAGNOSIS — H9193 Unspecified hearing loss, bilateral: Secondary | ICD-10-CM | POA: Diagnosis not present

## 2017-09-06 DIAGNOSIS — E1140 Type 2 diabetes mellitus with diabetic neuropathy, unspecified: Secondary | ICD-10-CM | POA: Diagnosis not present

## 2017-09-06 DIAGNOSIS — N183 Chronic kidney disease, stage 3 (moderate): Secondary | ICD-10-CM | POA: Diagnosis not present

## 2017-09-06 DIAGNOSIS — I10 Essential (primary) hypertension: Secondary | ICD-10-CM | POA: Diagnosis not present

## 2017-09-06 DIAGNOSIS — E1151 Type 2 diabetes mellitus with diabetic peripheral angiopathy without gangrene: Secondary | ICD-10-CM | POA: Diagnosis not present

## 2017-09-06 DIAGNOSIS — E039 Hypothyroidism, unspecified: Secondary | ICD-10-CM | POA: Diagnosis not present

## 2017-09-17 DIAGNOSIS — H903 Sensorineural hearing loss, bilateral: Secondary | ICD-10-CM | POA: Diagnosis not present

## 2017-10-22 DIAGNOSIS — H02823 Cysts of right eye, unspecified eyelid: Secondary | ICD-10-CM | POA: Diagnosis not present

## 2017-10-22 DIAGNOSIS — H538 Other visual disturbances: Secondary | ICD-10-CM | POA: Diagnosis not present

## 2017-10-22 DIAGNOSIS — L748 Other eccrine sweat disorders: Secondary | ICD-10-CM | POA: Diagnosis not present

## 2017-10-22 DIAGNOSIS — E119 Type 2 diabetes mellitus without complications: Secondary | ICD-10-CM | POA: Diagnosis not present

## 2017-10-22 DIAGNOSIS — H2511 Age-related nuclear cataract, right eye: Secondary | ICD-10-CM | POA: Diagnosis not present

## 2017-11-01 ENCOUNTER — Encounter (HOSPITAL_BASED_OUTPATIENT_CLINIC_OR_DEPARTMENT_OTHER): Payer: Self-pay | Admitting: *Deleted

## 2017-11-01 ENCOUNTER — Other Ambulatory Visit: Payer: Self-pay

## 2017-11-01 NOTE — Progress Notes (Signed)
SPOKE W/ PT VIA PHONE FOR PRE-OP INTERVIEW.  NPO AFTER MN.  ARRIVE AT 9470.  NEEDS ISTAT AND EKG.  WILL TAKE GABAPENTIN AND PROTONIX AM DOS W/ SIPS OF WATER.
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
## 2017-11-14 ENCOUNTER — Ambulatory Visit (HOSPITAL_BASED_OUTPATIENT_CLINIC_OR_DEPARTMENT_OTHER): Payer: Medicare Other | Admitting: Anesthesiology

## 2017-11-14 ENCOUNTER — Ambulatory Visit (HOSPITAL_BASED_OUTPATIENT_CLINIC_OR_DEPARTMENT_OTHER)
Admission: RE | Admit: 2017-11-14 | Discharge: 2017-11-14 | Disposition: A | Discharge status: Home / Self Care | Payer: Medicare Other | Source: Ambulatory Visit | Attending: Ophthalmology | Admitting: Ophthalmology

## 2017-11-14 ENCOUNTER — Encounter (HOSPITAL_BASED_OUTPATIENT_CLINIC_OR_DEPARTMENT_OTHER)
Admission: RE | Disposition: A | Discharge status: Home / Self Care | Payer: Self-pay | Source: Ambulatory Visit | Attending: Ophthalmology

## 2017-11-14 ENCOUNTER — Encounter (HOSPITAL_BASED_OUTPATIENT_CLINIC_OR_DEPARTMENT_OTHER): Payer: Self-pay

## 2017-11-14 DIAGNOSIS — M170 Bilateral primary osteoarthritis of knee: Secondary | ICD-10-CM | POA: Insufficient documentation

## 2017-11-14 DIAGNOSIS — Z9079 Acquired absence of other genital organ(s): Secondary | ICD-10-CM | POA: Insufficient documentation

## 2017-11-14 DIAGNOSIS — Z9889 Other specified postprocedural states: Secondary | ICD-10-CM | POA: Insufficient documentation

## 2017-11-14 DIAGNOSIS — Z7984 Long term (current) use of oral hypoglycemic drugs: Secondary | ICD-10-CM | POA: Diagnosis not present

## 2017-11-14 DIAGNOSIS — D23121 Other benign neoplasm of skin of left upper eyelid, including canthus: Secondary | ICD-10-CM | POA: Diagnosis not present

## 2017-11-14 DIAGNOSIS — E785 Hyperlipidemia, unspecified: Secondary | ICD-10-CM | POA: Diagnosis not present

## 2017-11-14 DIAGNOSIS — E1140 Type 2 diabetes mellitus with diabetic neuropathy, unspecified: Secondary | ICD-10-CM | POA: Diagnosis not present

## 2017-11-14 DIAGNOSIS — H02823 Cysts of right eye, unspecified eyelid: Secondary | ICD-10-CM | POA: Diagnosis not present

## 2017-11-14 DIAGNOSIS — I10 Essential (primary) hypertension: Secondary | ICD-10-CM | POA: Insufficient documentation

## 2017-11-14 DIAGNOSIS — H02825 Cysts of left lower eyelid: Secondary | ICD-10-CM | POA: Diagnosis not present

## 2017-11-14 DIAGNOSIS — E039 Hypothyroidism, unspecified: Secondary | ICD-10-CM | POA: Diagnosis not present

## 2017-11-14 DIAGNOSIS — Z9049 Acquired absence of other specified parts of digestive tract: Secondary | ICD-10-CM | POA: Insufficient documentation

## 2017-11-14 DIAGNOSIS — Z90710 Acquired absence of both cervix and uterus: Secondary | ICD-10-CM | POA: Insufficient documentation

## 2017-11-14 DIAGNOSIS — E119 Type 2 diabetes mellitus without complications: Secondary | ICD-10-CM | POA: Diagnosis not present

## 2017-11-14 DIAGNOSIS — M160 Bilateral primary osteoarthritis of hip: Secondary | ICD-10-CM | POA: Insufficient documentation

## 2017-11-14 DIAGNOSIS — K219 Gastro-esophageal reflux disease without esophagitis: Secondary | ICD-10-CM | POA: Diagnosis not present

## 2017-11-14 DIAGNOSIS — H2511 Age-related nuclear cataract, right eye: Secondary | ICD-10-CM | POA: Diagnosis not present

## 2017-11-14 DIAGNOSIS — Z79899 Other long term (current) drug therapy: Secondary | ICD-10-CM | POA: Diagnosis not present

## 2017-11-14 DIAGNOSIS — H02821 Cysts of right upper eyelid: Secondary | ICD-10-CM | POA: Insufficient documentation

## 2017-11-14 DIAGNOSIS — H538 Other visual disturbances: Secondary | ICD-10-CM | POA: Diagnosis not present

## 2017-11-14 DIAGNOSIS — H02822 Cysts of right lower eyelid: Secondary | ICD-10-CM | POA: Diagnosis not present

## 2017-11-14 DIAGNOSIS — L748 Other eccrine sweat disorders: Secondary | ICD-10-CM | POA: Diagnosis not present

## 2017-11-14 HISTORY — DX: Complete loss of teeth, unspecified cause, unspecified class: K08.109

## 2017-11-14 HISTORY — DX: Unspecified disorder of eyelid: H02.9

## 2017-11-14 HISTORY — DX: Presence of spectacles and contact lenses: Z97.3

## 2017-11-14 HISTORY — DX: Type 2 diabetes mellitus without complications: E11.9

## 2017-11-14 HISTORY — DX: Hypothyroidism, unspecified: E03.9

## 2017-11-14 HISTORY — PX: MASS EXCISION: SHX2000

## 2017-11-14 HISTORY — DX: Unspecified osteoarthritis, unspecified site: M19.90

## 2017-11-14 HISTORY — DX: Other specified postprocedural states: Z98.890

## 2017-11-14 HISTORY — DX: Hyperlipidemia, unspecified: E78.5

## 2017-11-14 HISTORY — DX: Sciatica, left side: M54.32

## 2017-11-14 HISTORY — DX: Presence of dental prosthetic device (complete) (partial): Z97.2

## 2017-11-14 HISTORY — DX: Polyneuropathy, unspecified: G62.9

## 2017-11-14 LAB — POCT I-STAT 4, (NA,K, GLUC, HGB,HCT)
Glucose, Bld: 92 mg/dL (ref 65–99)
HCT: 34 % — ABNORMAL LOW (ref 36.0–46.0)
Hemoglobin: 11.6 g/dL — ABNORMAL LOW (ref 12.0–15.0)
Potassium: 3.1 mmol/L — ABNORMAL LOW (ref 3.5–5.1)
Sodium: 144 mmol/L (ref 135–145)

## 2017-11-14 LAB — GLUCOSE, CAPILLARY
Glucose-Capillary: 50 mg/dL — ABNORMAL LOW (ref 65–99)
Glucose-Capillary: 99 mg/dL (ref 65–99)

## 2017-11-14 SURGERY — EXCISION MASS
Anesthesia: Monitor Anesthesia Care | Site: Eye | Laterality: Bilateral

## 2017-11-14 MED ORDER — ONDANSETRON HCL 4 MG/2ML IJ SOLN
INTRAMUSCULAR | Status: DC | PRN
  Administered 2017-11-14: 4 mg via INTRAVENOUS

## 2017-11-14 MED ORDER — LACTATED RINGERS IV SOLN
INTRAVENOUS | Status: DC
  Administered 2017-11-14: 10:00:00 via INTRAVENOUS
  Filled 2017-11-14: qty 1000

## 2017-11-14 MED ORDER — LIDOCAINE 2% (20 MG/ML) 5 ML SYRINGE
INTRAMUSCULAR | Status: AC
  Filled 2017-11-14: qty 5

## 2017-11-14 MED ORDER — PROPOFOL 10 MG/ML IV BOLUS
INTRAVENOUS | Status: AC
  Filled 2017-11-14: qty 20

## 2017-11-14 MED ORDER — TOBRAMYCIN-DEXAMETHASONE 0.3-0.1 % OP OINT
TOPICAL_OINTMENT | OPHTHALMIC | Status: DC | PRN
  Administered 2017-11-14: 1 via OPHTHALMIC

## 2017-11-14 MED ORDER — MIDAZOLAM HCL 5 MG/5ML IJ SOLN
INTRAMUSCULAR | Status: DC | PRN
  Administered 2017-11-14: 0.5 mg via INTRAVENOUS

## 2017-11-14 MED ORDER — PROPOFOL 10 MG/ML IV BOLUS
INTRAVENOUS | Status: DC | PRN
  Administered 2017-11-14: 20 mg via INTRAVENOUS

## 2017-11-14 MED ORDER — POVIDONE-IODINE 5 % OP SOLN
OPHTHALMIC | Status: DC | PRN
  Administered 2017-11-14: 1 via OPHTHALMIC

## 2017-11-14 MED ORDER — BSS IO SOLN
INTRAOCULAR | Status: DC | PRN
  Administered 2017-11-14: 15 mL via INTRAOCULAR

## 2017-11-14 MED ORDER — FENTANYL CITRATE (PF) 100 MCG/2ML IJ SOLN
INTRAMUSCULAR | Status: DC | PRN
  Administered 2017-11-14: 25 ug via INTRAVENOUS

## 2017-11-14 MED ORDER — ONDANSETRON HCL 4 MG/2ML IJ SOLN
INTRAMUSCULAR | Status: AC
  Filled 2017-11-14: qty 2

## 2017-11-14 MED ORDER — LIDOCAINE HCL (CARDIAC) 20 MG/ML IV SOLN
INTRAVENOUS | Status: DC | PRN
  Administered 2017-11-14: 60 mg via INTRAVENOUS

## 2017-11-14 MED ORDER — FENTANYL CITRATE (PF) 100 MCG/2ML IJ SOLN
INTRAMUSCULAR | Status: AC
  Filled 2017-11-14: qty 2

## 2017-11-14 MED ORDER — PROPOFOL 500 MG/50ML IV EMUL
INTRAVENOUS | Status: DC | PRN
  Administered 2017-11-14: 50 ug/kg/min via INTRAVENOUS

## 2017-11-14 MED ORDER — MIDAZOLAM HCL 2 MG/2ML IJ SOLN
INTRAMUSCULAR | Status: AC
  Filled 2017-11-14: qty 2

## 2017-11-14 MED ORDER — LIDOCAINE-EPINEPHRINE 2 %-1:100000 IJ SOLN
INTRAMUSCULAR | Status: DC | PRN
  Administered 2017-11-14: 1 mL

## 2017-11-14 MED ORDER — TOBRAMYCIN-DEXAMETHASONE 0.3-0.1 % OP OINT: 1.0000 "application " | TOPICAL_OINTMENT | Freq: Two times a day (BID) | OPHTHALMIC | 0 refills | Status: DC

## 2017-11-14 SURGICAL SUPPLY — 35 items
APL SKNCLS STERI-STRIP NONHPOA (GAUZE/BANDAGES/DRESSINGS) ×1
APL SRG 3 HI ABS STRL LF PLS (MISCELLANEOUS) ×2
APPLICATOR DR MATTHEWS STRL (MISCELLANEOUS) ×4 IMPLANT
BANDAGE EYE OVAL (MISCELLANEOUS) IMPLANT
BENZOIN TINCTURE PRP APPL 2/3 (GAUZE/BANDAGES/DRESSINGS) ×1 IMPLANT
BLADE SURG 11 STRL SS (BLADE) ×2 IMPLANT
CAUTERY EYE LOW TEMP 1300F FIN (OPHTHALMIC RELATED) ×2 IMPLANT
CORDS BIPOLAR (ELECTRODE) ×1 IMPLANT
COVER BACK TABLE 60X90IN (DRAPES) ×2 IMPLANT
COVER MAYO STAND STRL (DRAPES) ×2 IMPLANT
DRAPE LG THREE QUARTER DISP (DRAPES) ×2 IMPLANT
DRAPE SURG 17X23 STRL (DRAPES) ×6 IMPLANT
ELECT NDL TIP 2.8 STRL (NEEDLE) IMPLANT
ELECT NEEDLE TIP 2.8 STRL (NEEDLE) ×2 IMPLANT
ETHICON VICRYL ×1 IMPLANT
GLOVE BIO SURGEON STRL SZ 6.5 (GLOVE) ×1 IMPLANT
GLOVE BIOGEL PI IND STRL 6.5 (GLOVE) IMPLANT
GLOVE BIOGEL PI IND STRL 7.5 (GLOVE) IMPLANT
GLOVE BIOGEL PI INDICATOR 6.5 (GLOVE) ×1
GLOVE BIOGEL PI INDICATOR 7.5 (GLOVE) ×1
GLOVE SURG SIGNA 7.5 PF LTX (GLOVE) ×2 IMPLANT
GOWN STRL REUS W/TWL LRG LVL3 (GOWN DISPOSABLE) ×4 IMPLANT
KIT RM TURNOVER CYSTO AR (KITS) ×2 IMPLANT
NEEDLE HYPO 30X.5 LL (NEEDLE) ×2 IMPLANT
PACK BASIN DAY SURGERY FS (CUSTOM PROCEDURE TRAY) ×2 IMPLANT
PENCIL BUTTON HOLSTER BLD 10FT (ELECTRODE) ×1 IMPLANT
SPEAR EYE SURG WECK-CEL (MISCELLANEOUS) IMPLANT
STRIP CLOSURE SKIN 1/2X4 (GAUZE/BANDAGES/DRESSINGS) ×2 IMPLANT
SUT VICRYL 6 0 S 29 12 (SUTURE) ×1 IMPLANT
SUT VICRYL 8 0 TG140 8 (SUTURE) ×2 IMPLANT
SYR TB 1ML LL NO SAFETY (SYRINGE) ×2 IMPLANT
SYRINGE 3CC/18X1.5 ECLIPSE (MISCELLANEOUS) ×1 IMPLANT
TOWEL OR 17X24 6PK STRL BLUE (TOWEL DISPOSABLE) ×4 IMPLANT
TRAY DSU PREP LF (CUSTOM PROCEDURE TRAY) ×2 IMPLANT
WATER STERILE IRR 500ML POUR (IV SOLUTION) ×1 IMPLANT

## 2017-11-14 NOTE — Anesthesia Preprocedure Evaluation (Signed)
Anesthesia Evaluation  Patient identified by MRN, date of birth, ID band Patient awake    Reviewed: Allergy & Precautions, H&P , Patient's Chart, lab work & pertinent test results, reviewed documented beta blocker date and time   Airway Mallampati: II  TM Distance: >3 FB Neck ROM: full    Dental no notable dental hx.    Pulmonary    Pulmonary exam normal breath sounds clear to auscultation       Cardiovascular hypertension,  Rhythm:regular Rate:Normal     Neuro/Psych    GI/Hepatic GERD  ,  Endo/Other  diabetes  Renal/GU      Musculoskeletal   Abdominal   Peds  Hematology   Anesthesia Other Findings   Reproductive/Obstetrics                             Anesthesia Physical Anesthesia Plan  ASA: II  Anesthesia Plan: MAC   Post-op Pain Management:    Induction: Intravenous  PONV Risk Score and Plan: Treatment may vary due to age or medical condition and Ondansetron  Airway Management Planned: Mask and Natural Airway  Additional Equipment:   Intra-op Plan:   Post-operative Plan:   Informed Consent: I have reviewed the patients History and Physical, chart, labs and discussed the procedure including the risks, benefits and alternatives for the proposed anesthesia with the patient or authorized representative who has indicated his/her understanding and acceptance.   Dental Advisory Given  Plan Discussed with: CRNA and Surgeon  Anesthesia Plan Comments:         Anesthesia Quick Evaluation

## 2017-11-14 NOTE — Anesthesia Procedure Notes (Signed)
Procedure Name: MAC Date/Time: 11/14/2017 12:28 PM Performed by: Lyndle Herrlich, MD Pre-anesthesia Checklist: Patient identified, Emergency Drugs available, Suction available, Patient being monitored and Timeout performed Oxygen Delivery Method: Nasal cannula Placement Confirmation: positive ETCO2,  CO2 detector and breath sounds checked- equal and bilateral

## 2017-11-14 NOTE — Transfer of Care (Signed)
Last Vitals:  Vitals:   11/14/17 0859  BP: (!) 169/83  Pulse: 83  Resp: 16  Temp: 36.4 C  SpO2: 100%    Last Pain:  Vitals:   11/14/17 0859  TempSrc: Oral      Patients Stated Pain Goal: 7 (11/14/17 0712)  Immediate Anesthesia Transfer of Care Note  [name]  Procedure(s) Performed: Procedure(s) (LRB): EXCISIONAL BIOPSY OF LID LESIONS (Bilateral)  Patient Location: PACU  Anesthesia Type: MAC   Level of Consciousness: awake, alert  and oriented  Airway & Oxygen Therapy: Patient Spontanous Breathing and Patient connected to face mask oxygen  Post-op Assessment: Report given to PACU RN and Post -op Vital signs reviewed and stable  Post vital signs: Reviewed and stable  Complications: No apparent anesthesia complications

## 2017-11-14 NOTE — Interval H&P Note (Signed)
History and Physical Interval Note:  11/14/2017 12:12 PM  Vanessa Morrow  has presented today for surgery, with the diagnosis of bilateral eyelid lesions  The various methods of treatment have been discussed with the patient and family. After consideration of risks, benefits and other options for treatment, the patient has consented to  Procedure(s): EXCISIONAL BIOPSY OF LID LESIONS (Bilateral) as a surgical intervention .  The patient's history has been reviewed, patient examined, no change in status, stable for surgery.  I have reviewed the patient's chart and labs.  Questions were answered to the patient's satisfaction.     [name]

## 2017-11-14 NOTE — Brief Op Note (Signed)
11/14/2017  1:29 PM  PATIENT:  Vanessa Morrow  72 y.o. female  PRE-OPERATIVE DIAGNOSIS:  bilateral eyelid lesions  POST-OPERATIVE DIAGNOSIS:  bilateral eyelid lesions  PROCEDURE:  Procedure(s): EXCISIONAL BIOPSY OF LID LESIONS (Bilateral)  SURGEON:  Surgeon(s) and Role:    [name], MD - Primary  PHYSICIAN ASSISTANT:   ASSISTANTS: none   ANESTHESIA:   local and IV sedation  EBL:     BLOOD ADMINISTERED:none  DRAINS: none   LOCAL MEDICATIONS USED:  LIDOCAINE   SPECIMEN:  No Specimen  DISPOSITION OF SPECIMEN:  N/A  COUNTS:  YES  TOURNIQUET:  * No tourniquets in log *  DICTATION: .Other Dictation: Dictation Number [id number]  PLAN OF CARE: Discharge to home after PACU  PATIENT DISPOSITION:  PACU - hemodynamically stable.   Delay start of Pharmacological VTE agent (>24hrs) due to surgical blood loss or risk of bleeding: yes

## 2017-11-14 NOTE — Discharge Instructions (Signed)
°  Post Anesthesia Home Care Instructions  Activity: Get plenty of rest for the remainder of the day. A responsible individual must stay with you for 24 hours following the procedure.  For the next 24 hours, DO NOT: -Drive a car -Paediatric nurse -Drink alcoholic beverages -Take any medication unless instructed by your physician -Make any legal decisions or sign important papers.  Meals: Start with liquid foods such as gelatin or soup. Progress to regular foods as tolerated. Avoid greasy, spicy, heavy foods. If nausea and/or vomiting occur, drink only clear liquids until the nausea and/or vomiting subsides. Call your physician if vomiting continues.  Special Instructions/Symptoms: Your throat may feel dry or sore from the anesthesia or the breathing tube placed in your throat during surgery. If this causes discomfort, gargle with warm salt water. The discomfort should disappear within 24 hours.  If you had a scopolamine patch placed behind your ear for the management of post- operative nausea and/or vomiting:  1. The medication in the patch is effective for 72 hours, after which it should be removed.  Wrap patch in a tissue and discard in the trash. Wash hands thoroughly with soap and water. 2. You may remove the patch earlier than 72 hours if you experience unpleasant side effects which may include dry mouth, dizziness or visual disturbances. 3. Avoid touching the patch. Wash your hands with soap and water after contact with the patch.   Call your surgeon if you experience:   1.  Fever over 101.0. 2.  Inability to urinate. 3.  Nausea and/or vomiting. 4.  Extreme swelling or bruising at the surgical site. 5.  Continued bleeding from the incision. 6.  Increased pain, redness or drainage from the incision. 7.  Problems related to your pain medication. 8.  Any problems and/or concerns 9.  Any changes in vision

## 2017-11-15 ENCOUNTER — Encounter (HOSPITAL_BASED_OUTPATIENT_CLINIC_OR_DEPARTMENT_OTHER): Payer: Self-pay | Admitting: Ophthalmology

## 2017-11-15 NOTE — Op Note (Signed)
Vanessa Morrow, Vanessa Morrow                 ACCOUNT NO.:  [id number]  MEDICAL RECORD NO.:  [id number]  LOCATION:                                 FACILITY:  PHYSICIAN:  Venia Carbon. Frederico Morrow, M.D.    DATE OF BIRTH:  DATE OF PROCEDURE:  11/14/2017 DATE OF DISCHARGE:                              OPERATIVE REPORT   PREOPERATIVE DIAGNOSIS:  Bilateral lateral canthal eyelid lesions clinically identified as epidermal inclusion cysts and sudoriforous cyst.  POSTOPERATIVE DIAGNOSIS:  Status post removal of epidermal inclusion cyst and sudoriferous cyst from right lower eyelid of the lateral canthal region bilaterally.  PROCEDURE:  Excisional removal of epidermal inclusion cyst bilaterally.  ANESTHESIA:  Local with IV sedation.  SURGEON:  Venia Carbon. Frederico Morrow, M.D.  INDICATIONS FOR PROCEDURE:  Vanessa Morrow is an age 72 year old female with bilateral eyelid lesions, which are chronic and prolapsing into the visual axis and decreasing the visual field temporally.  This procedure indicated to remove the offending lesions and restore some normal lid anatomy and function.  The risks and benefits of the procedure explained to the patient prior to the procedure and informed consent was obtained.  DESCRIPTION OF TECHNIQUE:  The patient was taken into the operating room, placed in the supine position, and the patient was given an intravenous sedative, and the lesions were identified first in the right eye at the lateral canthus.  A sterile lid strip was placed to close the eyelid and the lesion was then injected with 2% lidocaine with epinephrine, approximately 0.5 mL given.  The local anesthesia was allowed to infiltrate to the tissues with pressure applied to the lesion site.  This lesion measured approximately 1.5 mm in its greatest dimension.  The margins of the lesion were then sharply demarcated with #11 blade.  Lesion was then dissected free from the subcutaneous tissue and the underlying orbicularis  with Bovie set on blend at #3 power. Dissection was carried circumferentially until the lesion was excised in total.  Hemostasis was then achieved with bipolar cautery. The Lesion defect was then closed with interrupted buried 6-0 Vicryl sutures, and the skin was then closed with interrupted 8-0 Vicryl sutures.  My attention was then directed to the left eye where an identical lesion was identified in the lateral canthus.  This lesion measured approximately 0.5 mm in its greatest dimension.  The lesion was then infiltrated with local anesthetic, lidocaine with epinephrine and then demarcated with a #11 blade and then sharply excised.  The lesion edges were then coapted with an interrupted 6-0 Vicryl suture in buried fashion and the skin was then closed with interrupted 6- 0 Vicryl.  A second lesion that was inferior to this lesion on the left eye was then treated with sharp excision and closed with interrupted 6-0 Vicryl suture.  At the conclusion of the procedure, the Tobradex ointment was then applied to the lesion sites.  There were no apparent complications.     Vanessa Morrow, M.D.   ______________________________ Venia Carbon. Frederico Morrow, M.D.    MAS/MEDQ  D:  11/14/2017  T:  11/14/2017  Job:  [id number]

## 2017-11-15 NOTE — Anesthesia Postprocedure Evaluation (Signed)
Anesthesia Post Note  [name]  Procedure(s) Performed: EXCISIONAL BIOPSY OF LID LESIONS (Bilateral Eye)     Patient location during evaluation: PACU Anesthesia Type: MAC Level of consciousness: awake and alert Pain management: pain level controlled Vital Signs Assessment: post-procedure vital signs reviewed and stable Respiratory status: spontaneous breathing, nonlabored ventilation, respiratory function stable and patient connected to nasal cannula oxygen Cardiovascular status: stable and blood pressure returned to baseline Postop Assessment: no apparent nausea or vomiting Anesthetic complications: no    Last Vitals:  Vitals:   11/14/17 1400 11/14/17 1500  BP: (!) 159/72 (!) 109/57  Pulse: 70 70  Resp: 18 16  Temp:  36.7 C  SpO2: 97% 96%    Last Pain:  Vitals:   11/14/17 0859  TempSrc: Oral   Pain Goal: Patients Stated Pain Goal: 7 (11/14/17 0918)               Lyndle Herrlich EDWARD

## 2017-11-21 DIAGNOSIS — E1140 Type 2 diabetes mellitus with diabetic neuropathy, unspecified: Secondary | ICD-10-CM | POA: Diagnosis not present

## 2017-11-21 DIAGNOSIS — E039 Hypothyroidism, unspecified: Secondary | ICD-10-CM | POA: Diagnosis not present

## 2017-11-21 DIAGNOSIS — R079 Chest pain, unspecified: Secondary | ICD-10-CM | POA: Diagnosis not present

## 2017-11-21 DIAGNOSIS — E1151 Type 2 diabetes mellitus with diabetic peripheral angiopathy without gangrene: Secondary | ICD-10-CM | POA: Diagnosis not present

## 2017-11-21 DIAGNOSIS — I10 Essential (primary) hypertension: Secondary | ICD-10-CM | POA: Diagnosis not present

## 2017-11-22 DIAGNOSIS — E039 Hypothyroidism, unspecified: Secondary | ICD-10-CM | POA: Diagnosis not present

## 2017-12-06 DIAGNOSIS — R079 Chest pain, unspecified: Secondary | ICD-10-CM | POA: Diagnosis not present

## 2017-12-28 DIAGNOSIS — R9431 Abnormal electrocardiogram [ECG] [EKG]: Secondary | ICD-10-CM | POA: Diagnosis not present

## 2017-12-28 DIAGNOSIS — I10 Essential (primary) hypertension: Secondary | ICD-10-CM | POA: Diagnosis not present

## 2017-12-28 DIAGNOSIS — I517 Cardiomegaly: Secondary | ICD-10-CM | POA: Diagnosis not present

## 2018-01-30 DIAGNOSIS — E1151 Type 2 diabetes mellitus with diabetic peripheral angiopathy without gangrene: Secondary | ICD-10-CM | POA: Diagnosis not present

## 2018-01-30 DIAGNOSIS — R946 Abnormal results of thyroid function studies: Secondary | ICD-10-CM | POA: Diagnosis not present

## 2018-01-30 DIAGNOSIS — E039 Hypothyroidism, unspecified: Secondary | ICD-10-CM | POA: Diagnosis not present

## 2018-01-30 DIAGNOSIS — N183 Chronic kidney disease, stage 3 (moderate): Secondary | ICD-10-CM | POA: Diagnosis not present

## 2018-01-30 DIAGNOSIS — I10 Essential (primary) hypertension: Secondary | ICD-10-CM | POA: Diagnosis not present

## 2018-01-30 DIAGNOSIS — E1140 Type 2 diabetes mellitus with diabetic neuropathy, unspecified: Secondary | ICD-10-CM | POA: Diagnosis not present

## 2018-05-03 DIAGNOSIS — N183 Chronic kidney disease, stage 3 (moderate): Secondary | ICD-10-CM | POA: Diagnosis not present

## 2018-05-03 DIAGNOSIS — E1151 Type 2 diabetes mellitus with diabetic peripheral angiopathy without gangrene: Secondary | ICD-10-CM | POA: Diagnosis not present

## 2018-05-03 DIAGNOSIS — E1140 Type 2 diabetes mellitus with diabetic neuropathy, unspecified: Secondary | ICD-10-CM | POA: Diagnosis not present

## 2018-05-03 DIAGNOSIS — I10 Essential (primary) hypertension: Secondary | ICD-10-CM | POA: Diagnosis not present

## 2018-05-03 DIAGNOSIS — E039 Hypothyroidism, unspecified: Secondary | ICD-10-CM | POA: Diagnosis not present

## 2018-06-10 DIAGNOSIS — E1151 Type 2 diabetes mellitus with diabetic peripheral angiopathy without gangrene: Secondary | ICD-10-CM | POA: Diagnosis not present

## 2018-06-10 DIAGNOSIS — I10 Essential (primary) hypertension: Secondary | ICD-10-CM | POA: Diagnosis not present

## 2018-06-10 DIAGNOSIS — R946 Abnormal results of thyroid function studies: Secondary | ICD-10-CM | POA: Diagnosis not present

## 2018-07-26 DIAGNOSIS — Z1231 Encounter for screening mammogram for malignant neoplasm of breast: Secondary | ICD-10-CM | POA: Diagnosis not present

## 2018-07-31 ENCOUNTER — Other Ambulatory Visit: Payer: Self-pay

## 2018-07-31 NOTE — Patient Outreach (Signed)
[place] [facility]) Care Management  07/31/2018  Vanessa Morrow 1945-11-14 [id number]   Medication Adherence call to Vanessa Morrow patient did not answer patient is due on Rosuvastatin 20 mg. Vanessa Morrow is showing past due under [place].   [facility] Management Direct Dial [phone]  Fax [phone] [email or web address]

## 2018-09-03 DIAGNOSIS — Z23 Encounter for immunization: Secondary | ICD-10-CM | POA: Diagnosis not present

## 2018-09-03 DIAGNOSIS — E1140 Type 2 diabetes mellitus with diabetic neuropathy, unspecified: Secondary | ICD-10-CM | POA: Diagnosis not present

## 2018-09-03 DIAGNOSIS — E039 Hypothyroidism, unspecified: Secondary | ICD-10-CM | POA: Diagnosis not present

## 2018-09-03 DIAGNOSIS — I10 Essential (primary) hypertension: Secondary | ICD-10-CM | POA: Diagnosis not present

## 2018-09-03 DIAGNOSIS — E1151 Type 2 diabetes mellitus with diabetic peripheral angiopathy without gangrene: Secondary | ICD-10-CM | POA: Diagnosis not present

## 2018-09-03 DIAGNOSIS — N183 Chronic kidney disease, stage 3 (moderate): Secondary | ICD-10-CM | POA: Diagnosis not present

## 2018-10-01 DIAGNOSIS — H538 Other visual disturbances: Secondary | ICD-10-CM | POA: Diagnosis not present

## 2018-10-01 DIAGNOSIS — H2511 Age-related nuclear cataract, right eye: Secondary | ICD-10-CM | POA: Diagnosis not present

## 2018-10-01 DIAGNOSIS — E119 Type 2 diabetes mellitus without complications: Secondary | ICD-10-CM | POA: Diagnosis not present

## 2018-11-21 DIAGNOSIS — E039 Hypothyroidism, unspecified: Secondary | ICD-10-CM | POA: Diagnosis not present

## 2018-11-21 DIAGNOSIS — E1140 Type 2 diabetes mellitus with diabetic neuropathy, unspecified: Secondary | ICD-10-CM | POA: Diagnosis not present

## 2018-11-21 DIAGNOSIS — K219 Gastro-esophageal reflux disease without esophagitis: Secondary | ICD-10-CM | POA: Diagnosis not present

## 2018-11-21 DIAGNOSIS — N183 Chronic kidney disease, stage 3 (moderate): Secondary | ICD-10-CM | POA: Diagnosis not present

## 2018-11-21 DIAGNOSIS — E1151 Type 2 diabetes mellitus with diabetic peripheral angiopathy without gangrene: Secondary | ICD-10-CM | POA: Diagnosis not present

## 2018-11-21 DIAGNOSIS — I10 Essential (primary) hypertension: Secondary | ICD-10-CM | POA: Diagnosis not present

## 2019-01-28 DIAGNOSIS — E039 Hypothyroidism, unspecified: Secondary | ICD-10-CM | POA: Diagnosis not present

## 2019-01-28 DIAGNOSIS — N2581 Secondary hyperparathyroidism of renal origin: Secondary | ICD-10-CM | POA: Diagnosis not present

## 2019-01-28 DIAGNOSIS — R809 Proteinuria, unspecified: Secondary | ICD-10-CM | POA: Diagnosis not present

## 2019-01-28 DIAGNOSIS — D649 Anemia, unspecified: Secondary | ICD-10-CM | POA: Diagnosis not present

## 2019-01-28 DIAGNOSIS — N183 Chronic kidney disease, stage 3 (moderate): Secondary | ICD-10-CM | POA: Diagnosis not present

## 2019-01-28 DIAGNOSIS — I129 Hypertensive chronic kidney disease with stage 1 through stage 4 chronic kidney disease, or unspecified chronic kidney disease: Secondary | ICD-10-CM | POA: Diagnosis not present

## 2019-01-28 DIAGNOSIS — E1122 Type 2 diabetes mellitus with diabetic chronic kidney disease: Secondary | ICD-10-CM | POA: Diagnosis not present

## 2019-03-21 ENCOUNTER — Other Ambulatory Visit: Payer: Self-pay

## 2019-03-21 NOTE — Patient Outreach (Signed)
[place] [facility]) Care Management  03/21/2019  Vanessa Morrow Mar 15, 1946 [id number]   Medication Adherence call to Mrs. Vanessa Morrow HIPPA Compliant Voice message left with a call back number. Mrs. Vanessa Morrow is showing past due on Amlodipine/ Benazepril 10/40 mg under [place].  [facility] Management Direct Dial [phone]  Fax [phone] [email or web address]

## 2019-04-25 ENCOUNTER — Other Ambulatory Visit: Payer: Self-pay

## 2019-04-25 NOTE — Patient Outreach (Signed)
[place] [facility]) Care Management  04/25/2019  Vanessa Morrow 02/07/1946 [id number]   Medication Adherence call to Vanessa Morrow [facility] Identifiers Verify spoke with patient she is past due on Amlodipine/Benazepril 10/40 mg patient explain she  stop taking this medication for about 3 month but,now she has started taking it aging. Vanessa Morrow is showing past due under [place].   [place] Management Direct Dial [phone]  Fax [phone] [email or web address]

## 2019-04-29 DIAGNOSIS — Z0001 Encounter for general adult medical examination with abnormal findings: Secondary | ICD-10-CM | POA: Diagnosis not present

## 2019-04-29 DIAGNOSIS — Z01118 Encounter for examination of ears and hearing with other abnormal findings: Secondary | ICD-10-CM | POA: Diagnosis not present

## 2019-04-29 DIAGNOSIS — Z1329 Encounter for screening for other suspected endocrine disorder: Secondary | ICD-10-CM | POA: Diagnosis not present

## 2019-04-29 DIAGNOSIS — E1165 Type 2 diabetes mellitus with hyperglycemia: Secondary | ICD-10-CM | POA: Diagnosis not present

## 2019-04-29 DIAGNOSIS — Z01021 Encounter for examination of eyes and vision following failed vision screening with abnormal findings: Secondary | ICD-10-CM | POA: Diagnosis not present

## 2019-05-29 DIAGNOSIS — M5416 Radiculopathy, lumbar region: Secondary | ICD-10-CM | POA: Insufficient documentation

## 2019-05-29 DIAGNOSIS — I209 Angina pectoris, unspecified: Secondary | ICD-10-CM | POA: Diagnosis not present

## 2019-06-17 DIAGNOSIS — M2578 Osteophyte, vertebrae: Secondary | ICD-10-CM | POA: Diagnosis not present

## 2019-06-17 DIAGNOSIS — M5416 Radiculopathy, lumbar region: Secondary | ICD-10-CM | POA: Diagnosis not present

## 2019-06-17 DIAGNOSIS — M5117 Intervertebral disc disorders with radiculopathy, lumbosacral region: Secondary | ICD-10-CM | POA: Diagnosis not present

## 2019-06-26 DIAGNOSIS — M5416 Radiculopathy, lumbar region: Secondary | ICD-10-CM | POA: Diagnosis not present

## 2019-07-01 ENCOUNTER — Telehealth: Payer: Self-pay

## 2019-07-01 NOTE — Telephone Encounter (Signed)
NOTES ON FILE FROM DR Mallie Mussel POOL [phone] SENT REFERRAL TO SCHEDULING

## 2019-07-15 DIAGNOSIS — M5416 Radiculopathy, lumbar region: Secondary | ICD-10-CM | POA: Diagnosis not present

## 2019-07-15 DIAGNOSIS — M5126 Other intervertebral disc displacement, lumbar region: Secondary | ICD-10-CM | POA: Diagnosis not present

## 2019-07-16 DIAGNOSIS — I10 Essential (primary) hypertension: Secondary | ICD-10-CM | POA: Diagnosis not present

## 2019-07-16 DIAGNOSIS — E1140 Type 2 diabetes mellitus with diabetic neuropathy, unspecified: Secondary | ICD-10-CM | POA: Diagnosis not present

## 2019-07-16 DIAGNOSIS — E1165 Type 2 diabetes mellitus with hyperglycemia: Secondary | ICD-10-CM | POA: Diagnosis not present

## 2019-07-16 DIAGNOSIS — E039 Hypothyroidism, unspecified: Secondary | ICD-10-CM | POA: Diagnosis not present

## 2019-07-16 DIAGNOSIS — N183 Chronic kidney disease, stage 3 (moderate): Secondary | ICD-10-CM | POA: Diagnosis not present

## 2019-07-23 DIAGNOSIS — M5126 Other intervertebral disc displacement, lumbar region: Secondary | ICD-10-CM | POA: Diagnosis not present

## 2019-07-23 DIAGNOSIS — I10 Essential (primary) hypertension: Secondary | ICD-10-CM | POA: Diagnosis not present

## 2019-07-30 ENCOUNTER — Encounter: Payer: Self-pay | Admitting: Cardiovascular Disease

## 2019-07-30 ENCOUNTER — Ambulatory Visit (INDEPENDENT_AMBULATORY_CARE_PROVIDER_SITE_OTHER): Payer: Medicare Other | Admitting: Cardiovascular Disease

## 2019-07-30 ENCOUNTER — Telehealth: Payer: Self-pay | Admitting: *Deleted

## 2019-07-30 ENCOUNTER — Other Ambulatory Visit: Payer: Self-pay

## 2019-07-30 DIAGNOSIS — E118 Type 2 diabetes mellitus with unspecified complications: Secondary | ICD-10-CM | POA: Diagnosis not present

## 2019-07-30 DIAGNOSIS — I10 Essential (primary) hypertension: Secondary | ICD-10-CM | POA: Diagnosis not present

## 2019-07-30 DIAGNOSIS — E785 Hyperlipidemia, unspecified: Secondary | ICD-10-CM | POA: Insufficient documentation

## 2019-07-30 DIAGNOSIS — Z794 Long term (current) use of insulin: Secondary | ICD-10-CM | POA: Insufficient documentation

## 2019-07-30 DIAGNOSIS — R079 Chest pain, unspecified: Secondary | ICD-10-CM | POA: Insufficient documentation

## 2019-07-30 DIAGNOSIS — R0789 Other chest pain: Secondary | ICD-10-CM

## 2019-07-30 DIAGNOSIS — N1832 Chronic kidney disease, stage 3b: Secondary | ICD-10-CM | POA: Insufficient documentation

## 2019-07-30 DIAGNOSIS — E1142 Type 2 diabetes mellitus with diabetic polyneuropathy: Secondary | ICD-10-CM | POA: Insufficient documentation

## 2019-07-30 DIAGNOSIS — N183 Chronic kidney disease, stage 3 unspecified: Secondary | ICD-10-CM

## 2019-07-30 DIAGNOSIS — E782 Mixed hyperlipidemia: Secondary | ICD-10-CM

## 2019-07-30 NOTE — Patient Instructions (Signed)
Medication Instructions:  Your physician recommends that you continue on your current medications as directed. Please refer to the Current Medication list given to you today.  If you need a refill on your cardiac medications before your next appointment, please call your pharmacy.   Lab work: none If you have labs (blood work) drawn today and your tests are completely normal, you will receive your results only by: Marland Kitchen MyChart Message (if you have MyChart) OR . A paper copy in the mail If you have any lab test that is abnormal or we need to change your treatment, we will call you to review the results.  Testing/Procedures: Your physician has requested that you have a lexiscan myoview. For further information please visit [email or web address]. Please follow instruction sheet, as given.   Cardiac Nuclear Scan A cardiac nuclear scan is a test that is done to check the flow of blood to your heart. It is done when you are resting and when you are exercising. The test looks for problems such as:  Not enough blood reaching a portion of the heart.  The heart muscle not working as it should. You may need this test if:  You have heart disease.  You have had lab results that are not normal.  You have had heart surgery or a balloon procedure to open up blocked arteries (angioplasty).  You have chest pain.  You have shortness of breath. In this test, a special dye (tracer) is put into your bloodstream. The tracer will travel to your heart. A camera will then take pictures of your heart to see how the tracer moves through your heart. This test is usually done at a hospital and takes 2-4 hours. Tell a doctor about:  Any allergies you have.  All medicines you are taking, including vitamins, herbs, eye drops, creams, and over-the-counter medicines.  Any problems you or family members have had with anesthetic medicines.  Any blood disorders you have.  Any surgeries you have had.  Any medical  conditions you have.  Whether you are pregnant or may be pregnant. What are the risks? Generally, this is a safe test. However, problems may occur, such as:  Serious chest pain and heart attack. This is only a risk if the stress portion of the test is done.  Rapid heartbeat.  A feeling of warmth in your chest. This feeling usually does not last long.  Allergic reaction to the tracer. What happens before the test?  Ask your doctor about changing or stopping your normal medicines. This is important.  Follow instructions from your doctor about what you cannot eat or drink.  Remove your jewelry on the day of the test. What happens during the test?  An IV tube will be inserted into one of your veins.  Your doctor will give you a small amount of tracer through the IV tube.  You will wait for 20-40 minutes while the tracer moves through your bloodstream.  Your heart will be monitored with an electrocardiogram (ECG).  You will lie down on an exam table.  Pictures of your heart will be taken for about 15-20 minutes.  You may also have a stress test. For this test, one of these things may be done: ? You will be asked to exercise on a treadmill or a stationary bike. ? You will be given medicines that will make your heart work harder. This is done if you are unable to exercise.  When blood flow to your heart has peaked, a  tracer will again be given through the IV tube.  After 20-40 minutes, you will get back on the exam table. More pictures will be taken of your heart.  Depending on the tracer that is used, more pictures may need to be taken 3-4 hours later.  Your IV tube will be removed when the test is over. The test may vary among doctors and hospitals. What happens after the test?  Ask your doctor: ? Whether you can return to your normal schedule, including diet, activities, and medicines. ? Whether you should drink more fluids. This will help to remove the tracer from your  body. Drink enough fluid to keep your pee (urine) pale yellow.  Ask your doctor, or the department that is doing the test: ? When will my results be ready? ? How will I get my results? Summary  A cardiac nuclear scan is a test that is done to check the flow of blood to your heart.  Tell your doctor whether you are pregnant or may be pregnant.  Before the test, ask your doctor about changing or stopping your normal medicines. This is important.  Ask your doctor whether you can return to your normal activities. You may be asked to drink more fluids. This information is not intended to replace advice given to you by your health care provider. Make sure you discuss any questions you have with your health care provider. Document Released: 04/08/2018 Document Revised: 02/12/2019 Document Reviewed: 04/08/2018 Elsevier Patient Education  [place]: At [facility], you and your health needs are our priority.  As part of our continuing mission to provide you with exceptional heart care, we have created designated Provider Care Teams.  These Care Teams include your primary Cardiologist (physician) and Advanced Practice Providers (APPs -  Physician Assistants and Nurse Practitioners) who all work together to provide you with the care you need, when you need it. You will need a follow up appointment in 6 months with Dr. Quay Burow unless your results are abnormal.  Please call our office 2 months in advance to schedule this/each appointment.

## 2019-07-30 NOTE — Progress Notes (Signed)
07/30/2019 Vanessa Morrow   12/13/1945  [id number]  Primary Physician Vanessa Mccreedy, MD Primary Cardiologist: Vanessa Harp MD Vanessa Morrow, [place]  HPI:  Vanessa Morrow is a 73 y.o. moderately overweight widowed African-American female mother of 4 children, grandmother of 42 grandchildren referred by Dr. Deri Morrow for cardiovascular valuation because of chest pain.  She sees Dr. Trenton Morrow for back pain.  She is accompanied by her daughter Vanessa Morrow today.  Her risk factors include treated hypertension, diabetes and hyperlipidemia.  She never smoked.  There is no family history of heart disease.  She is never had a heart attack or stroke.  She has had chest pain for last 15 years occurring mostly with exertion and using her upper extremities more frequent and severe over the last year or 2 now occurring on a daily basis.  Pain currently radiates to her left neck.   Current Meds  Medication Sig   amLODipine-benazepril (LOTREL) 10-40 MG per capsule Take 1 capsule by mouth every evening.    chlorthalidone (HYGROTON) 25 MG tablet Take 25 mg by mouth every evening.    cyclobenzaprine (FLEXERIL) 10 MG tablet Take 10 mg by mouth 3 (three) times daily as needed for muscle spasms.   exenatide (BYETTA 10 MCG PEN) 10 MCG/0.04ML SOPN Inject 10 mcg into the skin 2 (two) times daily with a meal.    gabapentin (NEURONTIN) 300 MG capsule Take 300 mg by mouth 2 (two) times daily.    glimepiride (AMARYL) 2 MG tablet Take 2 mg by mouth daily with breakfast.    HYDROcodone-acetaminophen (NORCO/VICODIN) 5-325 MG tablet Take 1 tablet by mouth every 6 (six) hours as needed for moderate pain.   levothyroxine (SYNTHROID, LEVOTHROID) 50 MCG tablet Take 50 mcg by mouth every evening.    pantoprazole (PROTONIX) 40 MG tablet Take 40 mg by mouth daily as needed.   rosuvastatin (CRESTOR) 20 MG tablet Take 20 mg by mouth every evening.    sitaGLIPtan-metformin (JANUMET) 50-1000 MG per tablet Take 1 tablet  by mouth 2 (two) times daily with a meal.    tobramycin-dexamethasone (TOBRADEX) ophthalmic ointment Place 1 application into the left eye 2 (two) times daily at 10 am and 4 pm.     No Known Allergies  Social History   Socioeconomic History   Marital status: Legally Separated    Spouse name: Not on file   Number of children: Not on file   Years of education: Not on file   Highest education level: Not on file  Occupational History   Not on file  Social Needs   Financial resource strain: Not on file   Food insecurity    Worry: Not on file    Inability: Not on file   Transportation needs    Medical: Not on file    Non-medical: Not on file  Tobacco Use   Smoking status: Never Smoker   Smokeless tobacco: Never Used  Substance and Sexual Activity   Alcohol use: No    Frequency: Never   Drug use: No   Sexual activity: Not on file  Lifestyle   Physical activity    Days per week: Not on file    Minutes per session: Not on file   Stress: Not on file  Relationships   Social connections    Talks on phone: Not on file    Gets together: Not on file    Attends religious service: Not on file    Active member  of club or organization: Not on file    Attends meetings of clubs or organizations: Not on file    Relationship status: Not on file   Intimate partner violence    Fear of current or ex partner: Not on file    Emotionally abused: Not on file    Physically abused: Not on file    Forced sexual activity: Not on file  Other Topics Concern   Not on file  Social History Narrative   Not on file     Review of Systems: General: negative for chills, fever, night sweats or weight changes.  Cardiovascular: negative for chest pain, dyspnea on exertion, edema, orthopnea, palpitations, paroxysmal nocturnal dyspnea or shortness of breath Dermatological: negative for rash Respiratory: negative for cough or wheezing Urologic: negative for hematuria Abdominal:  negative for nausea, vomiting, diarrhea, bright red blood per rectum, melena, or hematemesis Neurologic: negative for visual changes, syncope, or dizziness All other systems reviewed and are otherwise negative except as noted above.    Blood pressure 132/82, pulse 81, height 5\' 5"  (1.651 m), weight 208 lb (94.3 kg), SpO2 98 %.  General appearance: alert and no distress Neck: no adenopathy, no carotid bruit, no JVD, supple, symmetrical, trachea midline and thyroid not enlarged, symmetric, no tenderness/mass/nodules Lungs: clear to auscultation bilaterally Heart: regular rate and rhythm, S1, S2 normal, no murmur, click, rub or gallop Extremities: extremities normal, atraumatic, no cyanosis or edema Pulses: 2+ and symmetric Skin: Skin color, texture, turgor normal. No rashes or lesions Neurologic: Alert and oriented X 3, normal strength and tone. Normal symmetric reflexes. Normal coordination and gait  EKG sinus rhythm 81 with nonspecific ST and T wave changes.  I personally reviewed this EKG.  ASSESSMENT AND PLAN:   Chest pain of uncertain etiology Vanessa Morrow is had chest pain for last 15 years usually occurring with activity or exertion getting more frequent and severe over the last couple years now occurring on a daily basis.  She does have risk factors including diabetes, hypertension and hyperlipidemia.  Her serum creatinine is in the mid 1 range precluding her from having a coronary CTA.  When to get a prompt pharmacologic Myoview stress test to further evaluate  Essential hypertension History essentially potential blood pressure measured today 132/82.  She is on amlodipine, benazepril, chlorthalidone.  Continue current meds.  Hyperlipidemia History of hyperlipidemia on Crestor with lipid profile performed 02/20/2017 revealing total cholesterol 90 and HDL 34.      Vanessa Harp MD [facility], [facility] 07/30/2019 4:49 PM

## 2019-07-30 NOTE — Telephone Encounter (Signed)
Patient and daughter came here for a 4:00 pm appointment. Patient is scheduled with Dr. Gwenlyn Found at [facility]. I spoke with Alwyn Ren, scheduler and she stated that she will let them know that the patient and daughter is on their way there.

## 2019-07-30 NOTE — Assessment & Plan Note (Signed)
Vanessa Morrow is had chest pain for last 15 years usually occurring with activity or exertion getting more frequent and severe over the last couple years now occurring on a daily basis.  She does have risk factors including diabetes, hypertension and hyperlipidemia.  Her serum creatinine is in the mid 1 range precluding her from having a coronary CTA.  When to get a prompt pharmacologic Myoview stress test to further evaluate

## 2019-07-30 NOTE — Assessment & Plan Note (Signed)
History essentially potential blood pressure measured today 132/82.  She is on amlodipine, benazepril, chlorthalidone.  Continue current meds.

## 2019-07-30 NOTE — Assessment & Plan Note (Signed)
History of hyperlipidemia on Crestor with lipid profile performed 02/20/2017 revealing total cholesterol 90 and HDL 34.

## 2019-08-01 ENCOUNTER — Encounter (HOSPITAL_COMMUNITY): Payer: Self-pay | Admitting: Cardiovascular Disease

## 2019-08-06 ENCOUNTER — Telehealth (HOSPITAL_COMMUNITY): Payer: Self-pay

## 2019-08-06 NOTE — Telephone Encounter (Signed)
Spoke with the patient's daughter. She stated that she would be bringing her mother to the appointment. She will also stay with the patient due to Alzheimers/Dementia. Asked to call back with any questions. S.[place] EMTP

## 2019-08-06 NOTE — Telephone Encounter (Signed)
Spoke with the patient, instructions given. She stated that she would be here for her test. Asked to call back with any questions. S.Lipa Knauff EMTP 

## 2019-08-07 ENCOUNTER — Other Ambulatory Visit: Payer: Self-pay

## 2019-08-07 ENCOUNTER — Ambulatory Visit (HOSPITAL_COMMUNITY): Payer: Medicare Other | Attending: Cardiology

## 2019-08-07 DIAGNOSIS — R079 Chest pain, unspecified: Secondary | ICD-10-CM | POA: Insufficient documentation

## 2019-08-07 LAB — MYOCARDIAL PERFUSION IMAGING
LV dias vol: 74 mL (ref 46–106)
LV sys vol: 26 mL
Peak HR: 86 {beats}/min
Rest HR: 57 {beats}/min
SDS: 0
SRS: 0
SSS: 0
TID: 1

## 2019-08-07 MED ORDER — TECHNETIUM TC 99M TETROFOSMIN IV KIT
32.8000 | PACK | Freq: Once | INTRAVENOUS | Status: AC | PRN
  Administered 2019-08-07: 32.8 via INTRAVENOUS
  Filled 2019-08-07: qty 33

## 2019-08-07 MED ORDER — TECHNETIUM TC 99M TETROFOSMIN IV KIT
11.0000 | PACK | Freq: Once | INTRAVENOUS | Status: AC | PRN
  Administered 2019-08-07: 11 via INTRAVENOUS
  Filled 2019-08-07: qty 11

## 2019-08-07 MED ORDER — REGADENOSON 0.4 MG/5ML IV SOLN
0.4000 mg | Freq: Once | INTRAVENOUS | Status: AC
  Administered 2019-08-07: 0.4 mg via INTRAVENOUS

## 2019-08-13 DIAGNOSIS — I10 Essential (primary) hypertension: Secondary | ICD-10-CM | POA: Diagnosis not present

## 2019-08-13 DIAGNOSIS — M5416 Radiculopathy, lumbar region: Secondary | ICD-10-CM | POA: Diagnosis not present

## 2019-08-13 DIAGNOSIS — K219 Gastro-esophageal reflux disease without esophagitis: Secondary | ICD-10-CM | POA: Diagnosis not present

## 2019-08-13 DIAGNOSIS — E1140 Type 2 diabetes mellitus with diabetic neuropathy, unspecified: Secondary | ICD-10-CM | POA: Diagnosis not present

## 2019-08-13 DIAGNOSIS — E039 Hypothyroidism, unspecified: Secondary | ICD-10-CM | POA: Diagnosis not present

## 2019-08-13 DIAGNOSIS — M5126 Other intervertebral disc displacement, lumbar region: Secondary | ICD-10-CM | POA: Diagnosis not present

## 2019-08-13 DIAGNOSIS — E1165 Type 2 diabetes mellitus with hyperglycemia: Secondary | ICD-10-CM | POA: Diagnosis not present

## 2019-09-04 DIAGNOSIS — I10 Essential (primary) hypertension: Secondary | ICD-10-CM | POA: Diagnosis not present

## 2019-09-04 DIAGNOSIS — E1165 Type 2 diabetes mellitus with hyperglycemia: Secondary | ICD-10-CM | POA: Diagnosis not present

## 2019-09-04 DIAGNOSIS — E1140 Type 2 diabetes mellitus with diabetic neuropathy, unspecified: Secondary | ICD-10-CM | POA: Diagnosis not present

## 2019-09-04 DIAGNOSIS — Z0001 Encounter for general adult medical examination with abnormal findings: Secondary | ICD-10-CM | POA: Diagnosis not present

## 2019-09-04 DIAGNOSIS — E039 Hypothyroidism, unspecified: Secondary | ICD-10-CM | POA: Diagnosis not present

## 2019-09-04 DIAGNOSIS — Z1389 Encounter for screening for other disorder: Secondary | ICD-10-CM | POA: Diagnosis not present

## 2019-09-11 ENCOUNTER — Emergency Department (HOSPITAL_COMMUNITY): Payer: Medicare Other

## 2019-09-11 ENCOUNTER — Emergency Department (HOSPITAL_COMMUNITY)
Admission: EM | Admit: 2019-09-11 | Discharge: 2019-09-11 | Disposition: A | Discharge status: Home / Self Care | Payer: Medicare Other | Attending: Emergency Medicine | Admitting: Emergency Medicine

## 2019-09-11 ENCOUNTER — Other Ambulatory Visit: Payer: Self-pay

## 2019-09-11 ENCOUNTER — Encounter (HOSPITAL_COMMUNITY): Payer: Self-pay | Admitting: Emergency Medicine

## 2019-09-11 DIAGNOSIS — I10 Essential (primary) hypertension: Secondary | ICD-10-CM | POA: Insufficient documentation

## 2019-09-11 DIAGNOSIS — Y999 Unspecified external cause status: Secondary | ICD-10-CM | POA: Diagnosis not present

## 2019-09-11 DIAGNOSIS — Y93E1 Activity, personal bathing and showering: Secondary | ICD-10-CM | POA: Insufficient documentation

## 2019-09-11 DIAGNOSIS — E119 Type 2 diabetes mellitus without complications: Secondary | ICD-10-CM | POA: Insufficient documentation

## 2019-09-11 DIAGNOSIS — Z7984 Long term (current) use of oral hypoglycemic drugs: Secondary | ICD-10-CM | POA: Diagnosis not present

## 2019-09-11 DIAGNOSIS — Z79899 Other long term (current) drug therapy: Secondary | ICD-10-CM | POA: Diagnosis not present

## 2019-09-11 DIAGNOSIS — E039 Hypothyroidism, unspecified: Secondary | ICD-10-CM | POA: Insufficient documentation

## 2019-09-11 DIAGNOSIS — M79642 Pain in left hand: Secondary | ICD-10-CM | POA: Diagnosis not present

## 2019-09-11 DIAGNOSIS — Y92002 Bathroom of unspecified non-institutional (private) residence single-family (private) house as the place of occurrence of the external cause: Secondary | ICD-10-CM | POA: Insufficient documentation

## 2019-09-11 DIAGNOSIS — X58XXXA Exposure to other specified factors, initial encounter: Secondary | ICD-10-CM | POA: Diagnosis not present

## 2019-09-11 DIAGNOSIS — M25432 Effusion, left wrist: Secondary | ICD-10-CM | POA: Diagnosis not present

## 2019-09-11 DIAGNOSIS — M7989 Other specified soft tissue disorders: Secondary | ICD-10-CM | POA: Diagnosis not present

## 2019-09-11 DIAGNOSIS — Z20828 Contact with and (suspected) exposure to other viral communicable diseases: Secondary | ICD-10-CM | POA: Diagnosis not present

## 2019-09-11 DIAGNOSIS — M25532 Pain in left wrist: Secondary | ICD-10-CM | POA: Diagnosis not present

## 2019-09-11 LAB — BASIC METABOLIC PANEL
Anion gap: 12 (ref 5–15)
BUN: 18 mg/dL (ref 8–23)
CO2: 23 mmol/L (ref 22–32)
Calcium: 9.6 mg/dL (ref 8.9–10.3)
Chloride: 106 mmol/L (ref 98–111)
Creatinine, Ser: 1.6 mg/dL — ABNORMAL HIGH (ref 0.44–1.00)
GFR calc Af Amer: 37 mL/min — ABNORMAL LOW (ref >60)
GFR calc non Af Amer: 32 mL/min — ABNORMAL LOW (ref >60)
Glucose, Bld: 176 mg/dL — ABNORMAL HIGH (ref 70–99)
Potassium: 3.2 mmol/L — ABNORMAL LOW (ref 3.5–5.1)
Sodium: 141 mmol/L (ref 135–145)

## 2019-09-11 LAB — CBC WITH DIFFERENTIAL/PLATELET
Abs Immature Granulocytes: 0.12 10*3/uL — ABNORMAL HIGH (ref 0.00–0.07)
Abs Immature Granulocytes: 0.07 10*3/uL (ref 0.00–0.07)
Basophils Absolute: 0.1 10*3/uL (ref 0.0–0.1)
Basophils Absolute: 0.1 10*3/uL (ref 0.0–0.1)
Basophils Relative: 0 %
Basophils Relative: 0 %
Eosinophils Absolute: 0.1 10*3/uL (ref 0.0–0.5)
Eosinophils Absolute: 0.1 10*3/uL (ref 0.0–0.5)
Eosinophils Relative: 0 %
Eosinophils Relative: 1 %
HCT: 36.1 % (ref 36.0–46.0)
HCT: 36.1 % (ref 36.0–46.0)
Hemoglobin: 11.6 g/dL — ABNORMAL LOW (ref 12.0–15.0)
Hemoglobin: 11.8 g/dL — ABNORMAL LOW (ref 12.0–15.0)
Immature Granulocytes: 1 %
Immature Granulocytes: 1 %
Lymphocytes Relative: 19 %
Lymphocytes Relative: 25 %
Lymphs Abs: 3.4 10*3/uL (ref 0.7–4.0)
Lymphs Abs: 3.8 10*3/uL (ref 0.7–4.0)
MCH: 30.1 pg (ref 26.0–34.0)
MCH: 30.7 pg (ref 26.0–34.0)
MCHC: 32.1 g/dL (ref 30.0–36.0)
MCHC: 32.7 g/dL (ref 30.0–36.0)
MCV: 93.5 fL (ref 80.0–100.0)
MCV: 94 fL (ref 80.0–100.0)
Monocytes Absolute: 1.2 10*3/uL — ABNORMAL HIGH (ref 0.1–1.0)
Monocytes Absolute: 1.1 10*3/uL — ABNORMAL HIGH (ref 0.1–1.0)
Monocytes Relative: 7 %
Monocytes Relative: 8 %
Neutro Abs: 13.1 10*3/uL — ABNORMAL HIGH (ref 1.7–7.7)
Neutro Abs: 9.8 10*3/uL — ABNORMAL HIGH (ref 1.7–7.7)
Neutrophils Relative %: 73 %
Neutrophils Relative %: 65 %
Platelets: 378 10*3/uL (ref 150–400)
Platelets: 338 10*3/uL (ref 150–400)
RBC: 3.86 MIL/uL — ABNORMAL LOW (ref 3.87–5.11)
RBC: 3.84 MIL/uL — ABNORMAL LOW (ref 3.87–5.11)
RDW: 13.9 % (ref 11.5–15.5)
RDW: 14 % (ref 11.5–15.5)
WBC: 18 10*3/uL — ABNORMAL HIGH (ref 4.0–10.5)
WBC: 14.9 10*3/uL — ABNORMAL HIGH (ref 4.0–10.5)
nRBC: 0 % (ref 0.0–0.2)
nRBC: 0 % (ref 0.0–0.2)

## 2019-09-11 LAB — URIC ACID
Uric Acid, Serum: 7.8 mg/dL — ABNORMAL HIGH (ref 2.5–7.1)
Uric Acid, Serum: 7.8 mg/dL — ABNORMAL HIGH (ref 2.5–7.1)

## 2019-09-11 LAB — C-REACTIVE PROTEIN: CRP: 7.3 mg/dL — ABNORMAL HIGH (ref <1.0)

## 2019-09-11 LAB — SARS CORONAVIRUS 2 (TAT 6-24 HRS): SARS Coronavirus 2: NEGATIVE

## 2019-09-11 LAB — SEDIMENTATION RATE: Sed Rate: 97 mm/hr — ABNORMAL HIGH (ref 0–22)

## 2019-09-11 MED ORDER — GADOBUTROL 1 MMOL/ML IV SOLN
9.0000 mL | Freq: Once | INTRAVENOUS | Status: AC | PRN
  Administered 2019-09-11: 9 mL via INTRAVENOUS

## 2019-09-11 MED ORDER — HYDROCODONE-ACETAMINOPHEN 5-325 MG PO TABS
1.0000 | ORAL_TABLET | Freq: Once | ORAL | Status: AC
  Administered 2019-09-11: 1 via ORAL
  Filled 2019-09-11: qty 1

## 2019-09-11 MED ORDER — INDOMETHACIN 50 MG PO CAPS: 50.0000 mg | ORAL_CAPSULE | Freq: Three times a day (TID) | ORAL | 0 refills | Status: AC

## 2019-09-11 MED ORDER — DOXYCYCLINE HYCLATE 100 MG PO CAPS: 100.0000 mg | ORAL_CAPSULE | Freq: Two times a day (BID) | ORAL | 0 refills | Status: DC

## 2019-09-11 NOTE — ED Notes (Signed)
Pt given ICE pack. Waiting for xray.
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
## 2019-09-11 NOTE — ED Notes (Signed)
Discharge instructions reviewed with pt and family. Pt verbalized understanding.  

## 2019-09-11 NOTE — ED Notes (Signed)
Pt transported to MRI 

## 2019-09-11 NOTE — ED Triage Notes (Signed)
Pt complains of left left wrist pain since Sunday. Denies any injury. Pt wearing sling from home to comfort it. Pt states it is too painful to move.
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
## 2019-09-11 NOTE — ED Provider Notes (Signed)
Received this patient is a handoff from, [employer].   Patient presents to the ED with complaints of progressively worsening left wrist swelling and discomfort over the course of the past 4 days.  She denies any precipitating trauma or injury aside from a mild "bump" when she was getting out of the shower.  No alleviating or aggravating factors.  She denies any fevers, chills, recent notes, nausea or vomiting, chest pain or difficulty breathing, or any numbness, tingling, or weakness in the left hand or wrist.  Denies any history of autoimmune disease in the family.   Physical Exam  BP 135/74 (BP Location: Right Arm)   Pulse 79   Temp 98.2 F (36.8 C) (Oral)   Resp 18   Ht 5\' 6"  (1.676 m)   Wt 93.4 kg   SpO2 97%   BMI 33.25 kg/m   Physical Exam Left wrist: Significant tenderness to palpation and mild swelling, particularly on palmar aspect of distal ulna.  No significant erythema.  ROM and strength significantly limited due to pain.  No TTP with light touch.  Pulses and cap refill intact.  Distal sensation intact.  Skin intact.  ED Course/Procedures     Procedures  MDM   Agree with PA Merleen Nicely Ford's assessment that this is likely septic arthritis versus gout.  Her inflammatory markers are mildly elevated.  Lab work significant for leukocytosis of 18 with left shift.  She does not have any history of gout and she does not have any significant pain and discomfort with light touch. PA Orion Crook from orthopedics and Dr. Fredna Dow, hand surgery, have been consulted and are awaiting MRI findings.  PA Dietrich Pates proceed with fluoroscopy guided arthrocentesis of the left wrist to determine whether or not there is a bacterial infection.  Will likely washout in the OR if septic arthritis, otherwise we will treat for gout.  COVID-19 testing has been obtained.  4:10 PM Spoke with Silvestre Gunner, PA who states that if MRI demonstrates effusion, she will need IR arthrocentesis.   However, they typically leave by 6 PM, so if results are not available by then I should contact Dr. Fredna Dow to see if he would like to perform arthrocentesis.  6:56 PM MRI demonstrates significant tenosynovitis as well as concern for septic arthritis and cellulitis.  Multiple joints affected.  No significant effusion and arthrocentesis would be difficult.  7:14 PM Spoke with Dr. Fredna Dow and we will repeat CBC and uric acid levels.  Given that there is a lack of effusion on MRI, he suspects that it could be gout.  After Dr. Fredna Dow reevaluated the patient, he decided that we should treat the patient for gout with indomethacin but with doxycycline coverage and that he will evaluate her in the office tomorrow between 9 and 9:30 AM.  We will also place patient into a wrist splint for comfort.  Discussed plan with the patient and her daughter and they voiced understanding and are agreeable with the plan.  Patient is in no acute distress and feels okay for discharge home.      Corena Herter, PA-C 09/11/19 2037    Ezequiel Essex, MD 09/12/19 [phone]
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
## 2019-09-11 NOTE — Discharge Instructions (Signed)
Please see Dr. Fredna Dow in his office tomorrow between 9 and 9:30 AM.  Please take your medications as prescribed.  Return to the ED or seek medical attention for any new or worsening symptoms.

## 2019-09-12 DIAGNOSIS — M10032 Idiopathic gout, left wrist: Secondary | ICD-10-CM | POA: Diagnosis not present

## 2019-09-17 ENCOUNTER — Encounter: Payer: Medicare Other | Attending: Internal Medicine | Admitting: *Deleted

## 2019-09-17 ENCOUNTER — Other Ambulatory Visit: Payer: Self-pay

## 2019-09-17 DIAGNOSIS — E118 Type 2 diabetes mellitus with unspecified complications: Secondary | ICD-10-CM | POA: Diagnosis not present

## 2019-09-17 NOTE — Progress Notes (Signed)
Diabetes Self-Management Education  Visit Type:    Appt. Start Time: 0830 Appt. End Time: 0930  09/17/2019  [name], identified by name and date of birth, is a 73 y.o. female with a diagnosis of Diabetes:  . Patient is here with her daughter, Vanessa Morrow, who she lives with. They both participated in the visit in a positive way. Patient states she would like a refresher of diabetes information compared to what she learned 17 years ago. Daughter would like to know about portion sizes and quantities of food recommended. Patient tests her blood sugar daily, she states she tries to keep her post meal blood sugars under 120 mg/dl. She also states she is not aware of the action of her medications for her diabetes. She states she reads a lot and is curious about a lot of things.   ASSESSMENT  There were no vitals taken for this visit. There is no height or weight on file to calculate BMI.  Diabetes Self-Management Education - 09/17/19 0828      Health Coping   How would you rate your overall health?  Fair      Psychosocial Assessment   Patient Belief/Attitude about Diabetes  Motivated to manage diabetes    Self-care barriers  None    Self-management support  Family    Other persons present  Patient;Family Member    Patient Concerns  Nutrition/Meal planning;Glycemic Control    Special Needs  None    Preferred Learning Style  Auditory;Visual;Hands on    [place] in progress    How often do you need to have someone help you when you read instructions, pamphlets, or other written materials from your doctor or pharmacy?  3 - Sometimes    What is the last grade level you completed in school?  some college      Pre-Education Assessment   Patient understands the diabetes disease and treatment process.  Needs Review    Patient understands incorporating nutritional management into lifestyle.  Needs Review    Patient undertands incorporating physical activity into lifestyle.   Needs Review    Patient understands using medications safely.  Demonstrates understanding / competency    Patient understands monitoring blood glucose, interpreting and using results  Needs Review    Patient understands prevention, detection, and treatment of acute complications.  Needs Review    Patient understands prevention, detection, and treatment of chronic complications.  Demonstrates understanding / competency    Patient understands how to develop strategies to address psychosocial issues.  Demonstrates understanding / competency    Patient understands how to develop strategies to promote health/change behavior.  Demonstrates understanding / competency      Complications   Last HgB A1C per patient/outside source  6.5 %    How often do you check your blood sugar?  1-2 times/day    Fasting Blood glucose range (mg/dL)  70-129    Postprandial Blood glucose range (mg/dL)  70-129    Number of hypoglycemic episodes per month  2    Can you tell when your blood sugar is low?  Yes    What do you do if your blood sugar is low?  candy or juice    Have you had a dilated eye exam in the past 12 months?  Yes    Have you had a dental exam in the past 12 months?  No    Are you checking your feet?  Yes    How many days per  week are you checking your feet?  1      Dietary Intake   Breakfast  most mornings, sometimes appetite is poor; regular oatmeal with dried fruit and cinnamon OR shredded wheat or other whole grain cereal OR egg with cheese with toast with jelly    Snack (morning)  fresh fruit OR dried fruit OR nuts    Lunch  fresh fruit of any kind,    Dinner  green vegetables, occasionally fish or shrimp (6) as stir fry,    Snack (evening)  no    Beverage(s)  coffee with Sweet and Low and flavored cream, water, cranberry juice, tart cherry juice,      Exercise   Exercise Type  ADL's   she sweeps and mops her floors and lives on 2nd floor of apartment building   How many days per week to you  exercise?  3    How many minutes per day do you exercise?  15    Total minutes per week of exercise  45      Patient Education   Previous Diabetes Education  Yes (please comment)   when first diagnsed about 17 years ago   Disease state   Factors that contribute to the development of diabetes    Nutrition management   Role of diet in the treatment of diabetes and the relationship between the three main macronutrients and blood glucose level;Food label reading, portion sizes and measuring food.;Carbohydrate counting;Reviewed blood glucose goals for pre and post meals and how to evaluate the patients' food intake on their blood glucose level.;Meal timing in regards to the patients' current diabetes medication.    Physical activity and exercise   Helped patient identify appropriate exercises in relation to his/her diabetes, diabetes complications and other health issue.    Medications  Reviewed patients medication for diabetes, action, purpose, timing of dose and side effects.    Monitoring  Identified appropriate SMBG and/or A1C goals.    Chronic complications  Relationship between chronic complications and blood glucose control    Psychosocial adjustment  Role of stress on diabetes      Individualized Goals (developed by patient)   Nutrition  Follow meal plan discussed    Physical Activity  Exercise 3-5 times per week    Medications  take my medication as prescribed    Monitoring   test blood glucose pre and post meals as discussed    Problem Solving  causes, symptoms and treatment of low blood sugare. Can avoid if eat prior to increase in activity level.      Post-Education Assessment   Patient understands the diabetes disease and treatment process.  Demonstrates understanding / competency    Patient understands incorporating nutritional management into lifestyle.  Demonstrates understanding / competency    Patient undertands incorporating physical activity into lifestyle.  Demonstrates  understanding / competency    Patient understands using medications safely.  Demonstrates understanding / competency    Patient understands monitoring blood glucose, interpreting and using results  Demonstrates understanding / competency    Patient understands prevention, detection, and treatment of acute complications.  Demonstrates understanding / competency    Patient understands prevention, detection, and treatment of chronic complications.  Demonstrates understanding / competency    Patient understands how to develop strategies to address psychosocial issues.  Demonstrates understanding / competency    Patient understands how to develop strategies to promote health/change behavior.  Demonstrates understanding / competency      Outcomes   Expected  Outcomes  Demonstrated interest in learning. Expect positive outcomes    Future DMSE  PRN    Program Status  Not Completed       Individualized Plan for Diabetes Self-Management Training:   Learning Objective:  Patient will have a greater understanding of diabetes self-management. Patient education plan is to attend individual and/or group sessions per assessed needs and concerns.   Plan:   Patient Instructions  Plan:  Aim for 2 Carb Choices per meal (30 grams) +/- 1 either way  Aim for 0-1 Carbs per snack if hungry  Include protein in moderation with your meals and snacks Continue reading food labels for Total Carbohydrate of foods Consider  increasing your activity level by doing some chair exercises and/or some of your housework for 10-20 minutes daily as tolerated Continue checking Blood Glucose at alternate times per day  Continue taking medication as directed by MD  Expected Outcomes:  Demonstrated interest in learning. Expect positive outcomes  Education material provided: Food label handouts, A1C conversion sheet, Meal plan card and Carbohydrate counting sheet  If problems or questions, patient to contact team via:   Phone  Future DSME appointment: PRN

## 2019-09-17 NOTE — Patient Instructions (Addendum)
Plan:  Aim for 2 Carb Choices per meal (30 grams) +/- 1 either way  Aim for 0-1 Carbs per snack if hungry  Include protein in moderation with your meals and snacks Continue reading food labels for Total Carbohydrate of foods Consider  increasing your activity level by doing some chair exercises and/or some of your housework for 10-20 minutes daily as tolerated Continue checking Blood Glucose at alternate times per day  Continue taking medication as directed by MD

## 2019-10-21 DIAGNOSIS — M5416 Radiculopathy, lumbar region: Secondary | ICD-10-CM | POA: Diagnosis not present

## 2019-10-22 DIAGNOSIS — M778 Other enthesopathies, not elsewhere classified: Secondary | ICD-10-CM | POA: Diagnosis not present

## 2019-10-22 DIAGNOSIS — M10032 Idiopathic gout, left wrist: Secondary | ICD-10-CM | POA: Diagnosis not present

## 2019-11-12 DIAGNOSIS — K219 Gastro-esophageal reflux disease without esophagitis: Secondary | ICD-10-CM | POA: Diagnosis not present

## 2019-11-12 DIAGNOSIS — E039 Hypothyroidism, unspecified: Secondary | ICD-10-CM | POA: Diagnosis not present

## 2019-11-12 DIAGNOSIS — M10032 Idiopathic gout, left wrist: Secondary | ICD-10-CM | POA: Diagnosis not present

## 2019-11-12 DIAGNOSIS — E1140 Type 2 diabetes mellitus with diabetic neuropathy, unspecified: Secondary | ICD-10-CM | POA: Diagnosis not present

## 2019-11-12 DIAGNOSIS — I10 Essential (primary) hypertension: Secondary | ICD-10-CM | POA: Diagnosis not present

## 2019-11-12 DIAGNOSIS — E1165 Type 2 diabetes mellitus with hyperglycemia: Secondary | ICD-10-CM | POA: Diagnosis not present

## 2019-12-24 DIAGNOSIS — I10 Essential (primary) hypertension: Secondary | ICD-10-CM | POA: Diagnosis not present

## 2019-12-24 DIAGNOSIS — E039 Hypothyroidism, unspecified: Secondary | ICD-10-CM | POA: Diagnosis not present

## 2019-12-24 DIAGNOSIS — E1165 Type 2 diabetes mellitus with hyperglycemia: Secondary | ICD-10-CM | POA: Diagnosis not present

## 2019-12-24 DIAGNOSIS — E1140 Type 2 diabetes mellitus with diabetic neuropathy, unspecified: Secondary | ICD-10-CM | POA: Diagnosis not present

## 2019-12-24 DIAGNOSIS — K219 Gastro-esophageal reflux disease without esophagitis: Secondary | ICD-10-CM | POA: Diagnosis not present

## 2019-12-31 DIAGNOSIS — Z1231 Encounter for screening mammogram for malignant neoplasm of breast: Secondary | ICD-10-CM | POA: Diagnosis not present

## 2020-01-06 DIAGNOSIS — Z780 Asymptomatic menopausal state: Secondary | ICD-10-CM | POA: Diagnosis not present

## 2020-01-06 DIAGNOSIS — M8580 Other specified disorders of bone density and structure, unspecified site: Secondary | ICD-10-CM | POA: Diagnosis not present

## 2020-01-06 DIAGNOSIS — Z13820 Encounter for screening for osteoporosis: Secondary | ICD-10-CM | POA: Diagnosis not present

## 2020-01-21 DIAGNOSIS — M5416 Radiculopathy, lumbar region: Secondary | ICD-10-CM | POA: Diagnosis not present

## 2020-01-21 DIAGNOSIS — M5126 Other intervertebral disc displacement, lumbar region: Secondary | ICD-10-CM | POA: Diagnosis not present

## 2020-02-24 DIAGNOSIS — R809 Proteinuria, unspecified: Secondary | ICD-10-CM | POA: Diagnosis not present

## 2020-02-24 DIAGNOSIS — I129 Hypertensive chronic kidney disease with stage 1 through stage 4 chronic kidney disease, or unspecified chronic kidney disease: Secondary | ICD-10-CM | POA: Diagnosis not present

## 2020-02-24 DIAGNOSIS — E1122 Type 2 diabetes mellitus with diabetic chronic kidney disease: Secondary | ICD-10-CM | POA: Diagnosis not present

## 2020-02-24 DIAGNOSIS — D649 Anemia, unspecified: Secondary | ICD-10-CM | POA: Diagnosis not present

## 2020-02-24 DIAGNOSIS — N183 Chronic kidney disease, stage 3 unspecified: Secondary | ICD-10-CM | POA: Diagnosis not present

## 2020-04-02 DIAGNOSIS — E1140 Type 2 diabetes mellitus with diabetic neuropathy, unspecified: Secondary | ICD-10-CM | POA: Diagnosis not present

## 2020-04-02 DIAGNOSIS — E039 Hypothyroidism, unspecified: Secondary | ICD-10-CM | POA: Diagnosis not present

## 2020-04-02 DIAGNOSIS — K219 Gastro-esophageal reflux disease without esophagitis: Secondary | ICD-10-CM | POA: Diagnosis not present

## 2020-04-02 DIAGNOSIS — I10 Essential (primary) hypertension: Secondary | ICD-10-CM | POA: Diagnosis not present

## 2020-04-02 DIAGNOSIS — E1165 Type 2 diabetes mellitus with hyperglycemia: Secondary | ICD-10-CM | POA: Diagnosis not present

## 2020-06-13 DIAGNOSIS — Z03818 Encounter for observation for suspected exposure to other biological agents ruled out: Secondary | ICD-10-CM | POA: Diagnosis not present

## 2020-07-01 DIAGNOSIS — H43811 Vitreous degeneration, right eye: Secondary | ICD-10-CM | POA: Diagnosis not present

## 2020-07-01 DIAGNOSIS — H2511 Age-related nuclear cataract, right eye: Secondary | ICD-10-CM | POA: Diagnosis not present

## 2020-07-01 DIAGNOSIS — H538 Other visual disturbances: Secondary | ICD-10-CM | POA: Diagnosis not present

## 2020-07-01 DIAGNOSIS — E119 Type 2 diabetes mellitus without complications: Secondary | ICD-10-CM | POA: Diagnosis not present

## 2020-07-27 DIAGNOSIS — M5416 Radiculopathy, lumbar region: Secondary | ICD-10-CM | POA: Diagnosis not present

## 2020-07-27 DIAGNOSIS — M5126 Other intervertebral disc displacement, lumbar region: Secondary | ICD-10-CM | POA: Diagnosis not present

## 2020-07-27 DIAGNOSIS — I10 Essential (primary) hypertension: Secondary | ICD-10-CM | POA: Diagnosis not present

## 2020-08-13 IMAGING — DX DG HAND COMPLETE 3+V*L*
3 series · 3 of 3 positions shown · non-contrast
Comparison: None.

CLINICAL DATA: Left hand and wrist pain for 2 days. No reported
injury.

EXAM:
LEFT HAND - COMPLETE 3+ VIEW

[hand pa]
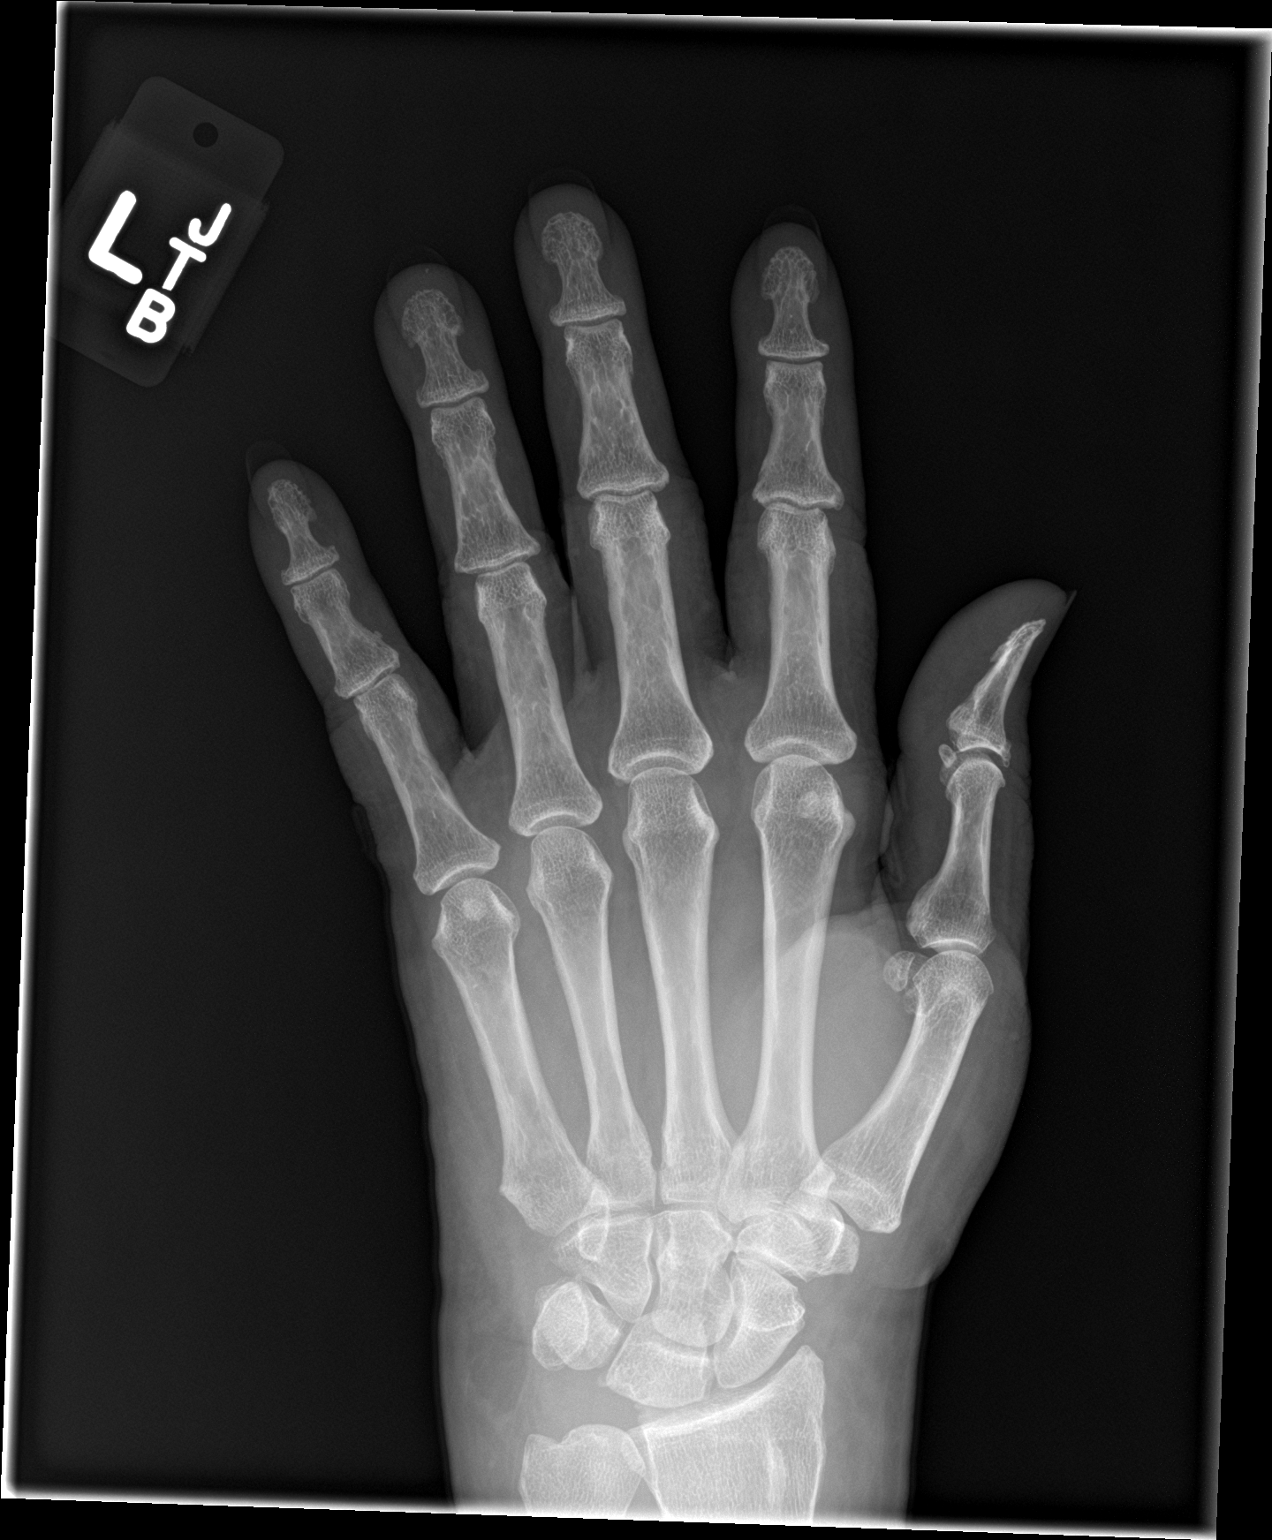

[hand obl]
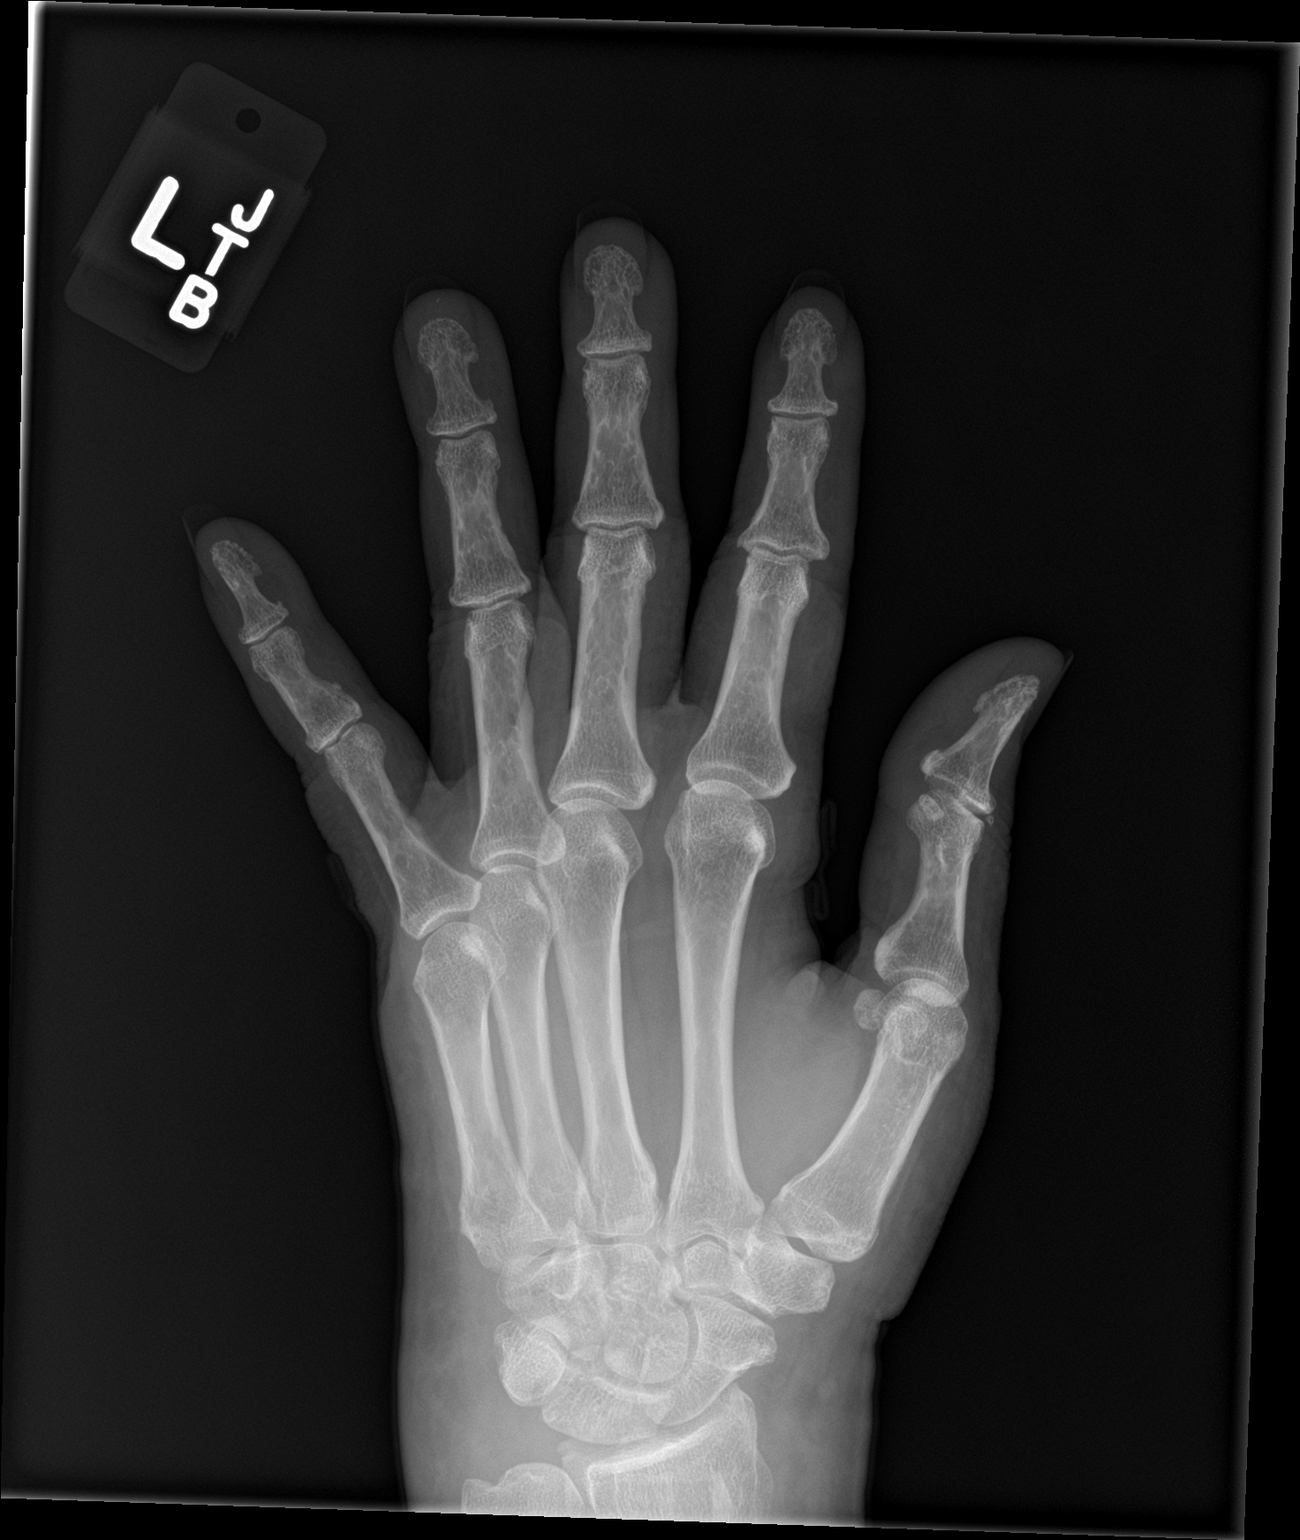

[hand lat]
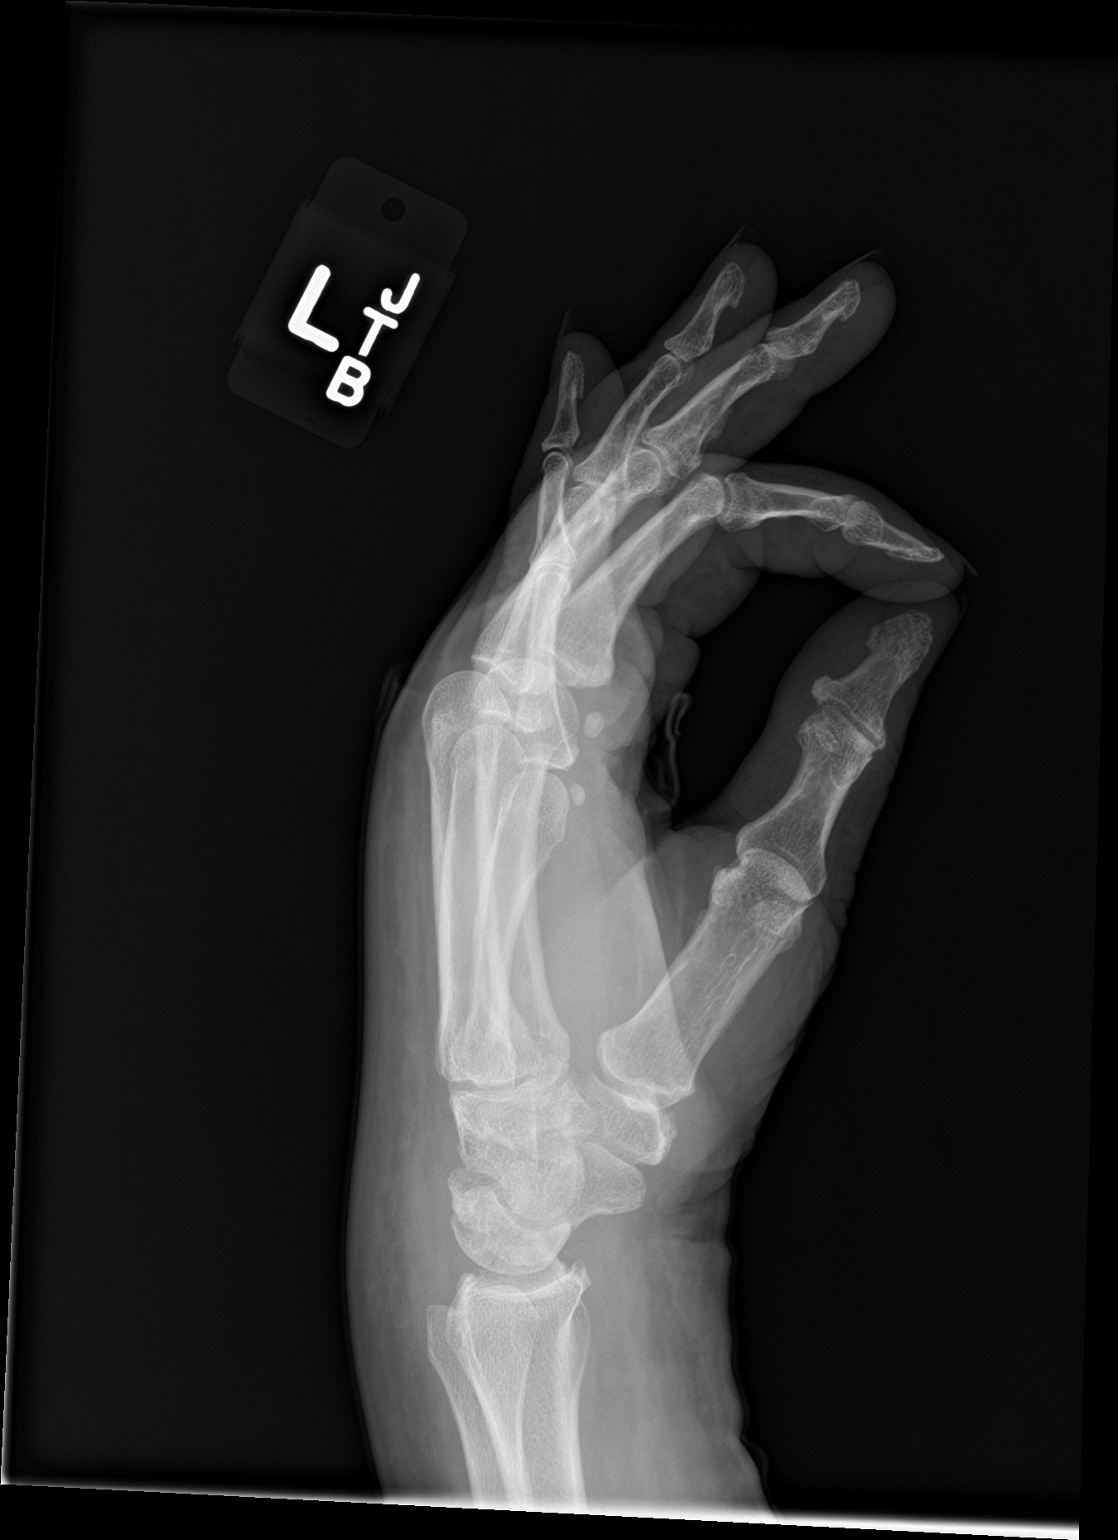

[3 of 3 positions shown; findings below may reference images not displayed]

FINDINGS: No fracture or dislocation. No suspicious focal osseous lesion.
Minimal interphalangeal joint left thumb osteoarthritis. Otherwise
no significant arthropathy. Soft tissue swelling throughout the left
wrist. No radiopaque foreign body.
IMPRESSION: No acute osseous abnormality in the left hand.

Minimal interphalangeal joint left thumb osteoarthritis.

Left wrist soft tissue swelling.

## 2020-09-22 DIAGNOSIS — I10 Essential (primary) hypertension: Secondary | ICD-10-CM | POA: Diagnosis not present

## 2020-09-22 DIAGNOSIS — M5126 Other intervertebral disc displacement, lumbar region: Secondary | ICD-10-CM | POA: Diagnosis not present

## 2020-09-22 DIAGNOSIS — M5416 Radiculopathy, lumbar region: Secondary | ICD-10-CM | POA: Diagnosis not present

## 2020-10-15 DIAGNOSIS — E1165 Type 2 diabetes mellitus with hyperglycemia: Secondary | ICD-10-CM | POA: Diagnosis not present

## 2020-10-15 DIAGNOSIS — K219 Gastro-esophageal reflux disease without esophagitis: Secondary | ICD-10-CM | POA: Diagnosis not present

## 2020-10-15 DIAGNOSIS — I10 Essential (primary) hypertension: Secondary | ICD-10-CM | POA: Diagnosis not present

## 2020-10-15 DIAGNOSIS — E039 Hypothyroidism, unspecified: Secondary | ICD-10-CM | POA: Diagnosis not present

## 2020-10-15 DIAGNOSIS — E1140 Type 2 diabetes mellitus with diabetic neuropathy, unspecified: Secondary | ICD-10-CM | POA: Diagnosis not present

## 2020-12-16 DIAGNOSIS — M25512 Pain in left shoulder: Secondary | ICD-10-CM | POA: Diagnosis not present

## 2020-12-16 DIAGNOSIS — E782 Mixed hyperlipidemia: Secondary | ICD-10-CM | POA: Diagnosis not present

## 2020-12-16 DIAGNOSIS — M1A0420 Idiopathic chronic gout, left hand, without tophus (tophi): Secondary | ICD-10-CM | POA: Diagnosis not present

## 2020-12-16 DIAGNOSIS — N1831 Chronic kidney disease, stage 3a: Secondary | ICD-10-CM | POA: Diagnosis not present

## 2020-12-16 DIAGNOSIS — I10 Essential (primary) hypertension: Secondary | ICD-10-CM | POA: Diagnosis not present

## 2020-12-16 DIAGNOSIS — E1165 Type 2 diabetes mellitus with hyperglycemia: Secondary | ICD-10-CM | POA: Diagnosis not present

## 2020-12-16 DIAGNOSIS — E1140 Type 2 diabetes mellitus with diabetic neuropathy, unspecified: Secondary | ICD-10-CM | POA: Diagnosis not present

## 2020-12-16 DIAGNOSIS — K219 Gastro-esophageal reflux disease without esophagitis: Secondary | ICD-10-CM | POA: Diagnosis not present

## 2020-12-16 DIAGNOSIS — E039 Hypothyroidism, unspecified: Secondary | ICD-10-CM | POA: Diagnosis not present

## 2020-12-17 DIAGNOSIS — M5416 Radiculopathy, lumbar region: Secondary | ICD-10-CM | POA: Diagnosis not present

## 2021-01-19 DIAGNOSIS — M1A0420 Idiopathic chronic gout, left hand, without tophus (tophi): Secondary | ICD-10-CM | POA: Diagnosis not present

## 2021-01-19 DIAGNOSIS — E039 Hypothyroidism, unspecified: Secondary | ICD-10-CM | POA: Diagnosis not present

## 2021-01-19 DIAGNOSIS — N1831 Chronic kidney disease, stage 3a: Secondary | ICD-10-CM | POA: Diagnosis not present

## 2021-01-19 DIAGNOSIS — E782 Mixed hyperlipidemia: Secondary | ICD-10-CM | POA: Diagnosis not present

## 2021-01-19 DIAGNOSIS — K219 Gastro-esophageal reflux disease without esophagitis: Secondary | ICD-10-CM | POA: Diagnosis not present

## 2021-01-19 DIAGNOSIS — E1165 Type 2 diabetes mellitus with hyperglycemia: Secondary | ICD-10-CM | POA: Diagnosis not present

## 2021-01-19 DIAGNOSIS — Z9189 Other specified personal risk factors, not elsewhere classified: Secondary | ICD-10-CM | POA: Diagnosis not present

## 2021-01-19 DIAGNOSIS — M25512 Pain in left shoulder: Secondary | ICD-10-CM | POA: Diagnosis not present

## 2021-01-19 DIAGNOSIS — I10 Essential (primary) hypertension: Secondary | ICD-10-CM | POA: Diagnosis not present

## 2021-01-19 DIAGNOSIS — E1140 Type 2 diabetes mellitus with diabetic neuropathy, unspecified: Secondary | ICD-10-CM | POA: Diagnosis not present

## 2021-02-22 ENCOUNTER — Ambulatory Visit (INDEPENDENT_AMBULATORY_CARE_PROVIDER_SITE_OTHER): Payer: Medicare Other | Admitting: Pulmonary Disease

## 2021-02-22 ENCOUNTER — Other Ambulatory Visit: Payer: Self-pay

## 2021-02-22 ENCOUNTER — Encounter: Payer: Self-pay | Admitting: Pulmonary Disease

## 2021-02-22 DIAGNOSIS — F5104 Psychophysiologic insomnia: Secondary | ICD-10-CM | POA: Insufficient documentation

## 2021-02-22 MED ORDER — TRAZODONE HCL 50 MG PO TABS: ORAL_TABLET | ORAL | 0 refills | Status: DC

## 2021-02-22 NOTE — Patient Instructions (Signed)
Rules of sleep hygiene were discussed  - light exercise x 30 mins daily -avoid caffeinated beverages - no more than 20 mins staying awake in bed, if not asleep, get out of bed & reading or light music - No TV or computer games at bedtime. Set fixed wake up time @ 8am  Referral to insomnia therapist  Rx for trazodone 50 mg tabs #30 - start at 1 tab @ 11pm  If no effect, can increase to 2 tabs in 3 days, then call to report
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
## 2021-02-22 NOTE — Assessment & Plan Note (Addendum)
This appears to be longstanding for more than 20 years.  There does not appear to be any comorbid mental health disorders or medical issues causing insomnia. There is no history of history of circadian rhythm disorder or shiftwork disorder.  Her sleep hygiene is poor and we emphasized this today. We will also refer her to cognitive behavioral therapy  Rules of sleep hygiene were discussed  - light exercise x 30 mins daily -avoid caffeinated beverages - no more than 20 mins staying awake in bed, if not asleep, get out of bed & reading or light music - No TV or computer games at bedtime. Set fixed wake up time @ 8am -I discussed sleep consolidation technique  We will trial trazodone, concern for Ambien in this age group Rx for trazodone 50 mg tabs #30 - start at 1 tab @ 11pm  If no effect, can increase to 2 tabs in 3 days, then call to report

## 2021-02-28 DIAGNOSIS — N1831 Chronic kidney disease, stage 3a: Secondary | ICD-10-CM | POA: Diagnosis not present

## 2021-02-28 DIAGNOSIS — E782 Mixed hyperlipidemia: Secondary | ICD-10-CM | POA: Diagnosis not present

## 2021-02-28 DIAGNOSIS — E1140 Type 2 diabetes mellitus with diabetic neuropathy, unspecified: Secondary | ICD-10-CM | POA: Diagnosis not present

## 2021-02-28 DIAGNOSIS — E039 Hypothyroidism, unspecified: Secondary | ICD-10-CM | POA: Diagnosis not present

## 2021-02-28 DIAGNOSIS — M1A0420 Idiopathic chronic gout, left hand, without tophus (tophi): Secondary | ICD-10-CM | POA: Diagnosis not present

## 2021-02-28 DIAGNOSIS — E1165 Type 2 diabetes mellitus with hyperglycemia: Secondary | ICD-10-CM | POA: Diagnosis not present

## 2021-02-28 DIAGNOSIS — Z9189 Other specified personal risk factors, not elsewhere classified: Secondary | ICD-10-CM | POA: Diagnosis not present

## 2021-02-28 DIAGNOSIS — K219 Gastro-esophageal reflux disease without esophagitis: Secondary | ICD-10-CM | POA: Diagnosis not present

## 2021-02-28 DIAGNOSIS — I10 Essential (primary) hypertension: Secondary | ICD-10-CM | POA: Diagnosis not present

## 2021-02-28 DIAGNOSIS — M25512 Pain in left shoulder: Secondary | ICD-10-CM | POA: Diagnosis not present

## 2021-03-02 ENCOUNTER — Other Ambulatory Visit: Payer: Self-pay | Admitting: Pulmonary Disease

## 2021-03-02 NOTE — Telephone Encounter (Signed)
OK to send 90 day supply 

## 2021-03-02 NOTE — Telephone Encounter (Signed)
Changes Requested   Name from pharmacy: TRAZODONE 50 MG TABLET       Will file in chart as: traZODone (DESYREL) 50 MG tablet   Sig: Take one to two tablets at 11pm   Disp:  180 tablet  Refills:  1   Start: 03/02/2021   Class: Normal   Non-formulary   Last ordered: 1 week ago by [name], MD Last refill: 02/22/2021   Rx #: [id number]   Pharmacy comment: REQUEST FOR 90 DAYS PRESCRIPTION.     Dr. Elsworth Soho, please advise if you are okay sending Rx for 90-day supply

## 2021-03-23 DIAGNOSIS — H905 Unspecified sensorineural hearing loss: Secondary | ICD-10-CM | POA: Diagnosis not present

## 2021-03-25 DIAGNOSIS — M47816 Spondylosis without myelopathy or radiculopathy, lumbar region: Secondary | ICD-10-CM | POA: Insufficient documentation

## 2021-04-06 DIAGNOSIS — D649 Anemia, unspecified: Secondary | ICD-10-CM | POA: Diagnosis not present

## 2021-04-06 DIAGNOSIS — N2581 Secondary hyperparathyroidism of renal origin: Secondary | ICD-10-CM | POA: Diagnosis not present

## 2021-04-06 DIAGNOSIS — E1122 Type 2 diabetes mellitus with diabetic chronic kidney disease: Secondary | ICD-10-CM | POA: Diagnosis not present

## 2021-04-06 DIAGNOSIS — N183 Chronic kidney disease, stage 3 unspecified: Secondary | ICD-10-CM | POA: Diagnosis not present

## 2021-04-06 DIAGNOSIS — E039 Hypothyroidism, unspecified: Secondary | ICD-10-CM | POA: Diagnosis not present

## 2021-04-06 DIAGNOSIS — R809 Proteinuria, unspecified: Secondary | ICD-10-CM | POA: Diagnosis not present

## 2021-04-06 DIAGNOSIS — I129 Hypertensive chronic kidney disease with stage 1 through stage 4 chronic kidney disease, or unspecified chronic kidney disease: Secondary | ICD-10-CM | POA: Diagnosis not present

## 2021-04-13 DIAGNOSIS — K219 Gastro-esophageal reflux disease without esophagitis: Secondary | ICD-10-CM | POA: Diagnosis not present

## 2021-04-13 DIAGNOSIS — N1831 Chronic kidney disease, stage 3a: Secondary | ICD-10-CM | POA: Diagnosis not present

## 2021-04-13 DIAGNOSIS — M1A0420 Idiopathic chronic gout, left hand, without tophus (tophi): Secondary | ICD-10-CM | POA: Diagnosis not present

## 2021-04-13 DIAGNOSIS — E039 Hypothyroidism, unspecified: Secondary | ICD-10-CM | POA: Diagnosis not present

## 2021-04-13 DIAGNOSIS — Z9189 Other specified personal risk factors, not elsewhere classified: Secondary | ICD-10-CM | POA: Diagnosis not present

## 2021-04-13 DIAGNOSIS — M25512 Pain in left shoulder: Secondary | ICD-10-CM | POA: Diagnosis not present

## 2021-04-13 DIAGNOSIS — E782 Mixed hyperlipidemia: Secondary | ICD-10-CM | POA: Diagnosis not present

## 2021-04-13 DIAGNOSIS — I10 Essential (primary) hypertension: Secondary | ICD-10-CM | POA: Diagnosis not present

## 2021-04-13 DIAGNOSIS — E1165 Type 2 diabetes mellitus with hyperglycemia: Secondary | ICD-10-CM | POA: Diagnosis not present

## 2021-04-13 DIAGNOSIS — E1140 Type 2 diabetes mellitus with diabetic neuropathy, unspecified: Secondary | ICD-10-CM | POA: Diagnosis not present

## 2021-05-24 DIAGNOSIS — M47816 Spondylosis without myelopathy or radiculopathy, lumbar region: Secondary | ICD-10-CM | POA: Diagnosis not present

## 2021-08-24 DIAGNOSIS — E1140 Type 2 diabetes mellitus with diabetic neuropathy, unspecified: Secondary | ICD-10-CM | POA: Diagnosis not present

## 2021-08-24 DIAGNOSIS — K219 Gastro-esophageal reflux disease without esophagitis: Secondary | ICD-10-CM | POA: Diagnosis not present

## 2021-08-24 DIAGNOSIS — E039 Hypothyroidism, unspecified: Secondary | ICD-10-CM | POA: Diagnosis not present

## 2021-08-24 DIAGNOSIS — R0789 Other chest pain: Secondary | ICD-10-CM | POA: Diagnosis not present

## 2021-08-24 DIAGNOSIS — I10 Essential (primary) hypertension: Secondary | ICD-10-CM | POA: Diagnosis not present

## 2021-08-24 DIAGNOSIS — R0602 Shortness of breath: Secondary | ICD-10-CM | POA: Diagnosis not present

## 2021-08-24 DIAGNOSIS — E782 Mixed hyperlipidemia: Secondary | ICD-10-CM | POA: Diagnosis not present

## 2021-08-24 DIAGNOSIS — N1831 Chronic kidney disease, stage 3a: Secondary | ICD-10-CM | POA: Diagnosis not present

## 2021-08-24 DIAGNOSIS — R42 Dizziness and giddiness: Secondary | ICD-10-CM | POA: Diagnosis not present

## 2021-08-24 DIAGNOSIS — E1165 Type 2 diabetes mellitus with hyperglycemia: Secondary | ICD-10-CM | POA: Diagnosis not present

## 2021-08-24 DIAGNOSIS — Z9189 Other specified personal risk factors, not elsewhere classified: Secondary | ICD-10-CM | POA: Diagnosis not present

## 2021-08-26 DIAGNOSIS — R0789 Other chest pain: Secondary | ICD-10-CM | POA: Diagnosis not present

## 2021-08-26 DIAGNOSIS — R0602 Shortness of breath: Secondary | ICD-10-CM | POA: Diagnosis not present

## 2021-08-29 ENCOUNTER — Emergency Department (HOSPITAL_COMMUNITY): Payer: Medicare Other

## 2021-08-29 ENCOUNTER — Encounter (HOSPITAL_COMMUNITY): Payer: Self-pay | Admitting: Emergency Medicine

## 2021-08-29 ENCOUNTER — Observation Stay (HOSPITAL_COMMUNITY)
Admission: EM | Admit: 2021-08-29 | Discharge: 2021-08-30 | Disposition: A | Discharge status: Home / Self Care | Payer: Medicare Other | Attending: Family Medicine | Admitting: Family Medicine

## 2021-08-29 DIAGNOSIS — E039 Hypothyroidism, unspecified: Secondary | ICD-10-CM | POA: Diagnosis present

## 2021-08-29 DIAGNOSIS — Z7982 Long term (current) use of aspirin: Secondary | ICD-10-CM | POA: Insufficient documentation

## 2021-08-29 DIAGNOSIS — E1122 Type 2 diabetes mellitus with diabetic chronic kidney disease: Secondary | ICD-10-CM | POA: Insufficient documentation

## 2021-08-29 DIAGNOSIS — R079 Chest pain, unspecified: Secondary | ICD-10-CM | POA: Diagnosis not present

## 2021-08-29 DIAGNOSIS — I129 Hypertensive chronic kidney disease with stage 1 through stage 4 chronic kidney disease, or unspecified chronic kidney disease: Secondary | ICD-10-CM | POA: Diagnosis not present

## 2021-08-29 DIAGNOSIS — K219 Gastro-esophageal reflux disease without esophagitis: Secondary | ICD-10-CM | POA: Diagnosis present

## 2021-08-29 DIAGNOSIS — E1169 Type 2 diabetes mellitus with other specified complication: Secondary | ICD-10-CM | POA: Diagnosis present

## 2021-08-29 DIAGNOSIS — Z20822 Contact with and (suspected) exposure to covid-19: Secondary | ICD-10-CM | POA: Diagnosis not present

## 2021-08-29 DIAGNOSIS — R0789 Other chest pain: Secondary | ICD-10-CM | POA: Diagnosis not present

## 2021-08-29 DIAGNOSIS — E876 Hypokalemia: Secondary | ICD-10-CM | POA: Diagnosis present

## 2021-08-29 DIAGNOSIS — Z79899 Other long term (current) drug therapy: Secondary | ICD-10-CM | POA: Diagnosis not present

## 2021-08-29 DIAGNOSIS — Z794 Long term (current) use of insulin: Secondary | ICD-10-CM | POA: Diagnosis not present

## 2021-08-29 DIAGNOSIS — I10 Essential (primary) hypertension: Secondary | ICD-10-CM | POA: Diagnosis present

## 2021-08-29 DIAGNOSIS — E1142 Type 2 diabetes mellitus with diabetic polyneuropathy: Secondary | ICD-10-CM

## 2021-08-29 DIAGNOSIS — N1832 Chronic kidney disease, stage 3b: Secondary | ICD-10-CM | POA: Diagnosis not present

## 2021-08-29 LAB — COMPREHENSIVE METABOLIC PANEL
ALT: 18 U/L (ref 0–44)
AST: 26 U/L (ref 15–41)
Albumin: 3.5 g/dL (ref 3.5–5.0)
Alkaline Phosphatase: 55 U/L (ref 38–126)
Anion gap: 11 (ref 5–15)
BUN: 18 mg/dL (ref 8–23)
CO2: 28 mmol/L (ref 22–32)
Calcium: 9.4 mg/dL (ref 8.9–10.3)
Chloride: 99 mmol/L (ref 98–111)
Creatinine, Ser: 1.58 mg/dL — ABNORMAL HIGH (ref 0.44–1.00)
GFR, Estimated: 34 mL/min — ABNORMAL LOW (ref >60)
Glucose, Bld: 167 mg/dL — ABNORMAL HIGH (ref 70–99)
Potassium: 3.2 mmol/L — ABNORMAL LOW (ref 3.5–5.1)
Sodium: 138 mmol/L (ref 135–145)
Total Bilirubin: 0.5 mg/dL (ref 0.3–1.2)
Total Protein: 6.8 g/dL (ref 6.5–8.1)

## 2021-08-29 LAB — CBC WITH DIFFERENTIAL/PLATELET
Abs Immature Granulocytes: 0.03 10*3/uL (ref 0.00–0.07)
Basophils Absolute: 0.1 10*3/uL (ref 0.0–0.1)
Basophils Relative: 1 %
Eosinophils Absolute: 0.1 10*3/uL (ref 0.0–0.5)
Eosinophils Relative: 1 %
HCT: 36.7 % (ref 36.0–46.0)
Hemoglobin: 11.9 g/dL — ABNORMAL LOW (ref 12.0–15.0)
Immature Granulocytes: 0 %
Lymphocytes Relative: 37 %
Lymphs Abs: 3.8 10*3/uL (ref 0.7–4.0)
MCH: 30.2 pg (ref 26.0–34.0)
MCHC: 32.4 g/dL (ref 30.0–36.0)
MCV: 93.1 fL (ref 80.0–100.0)
Monocytes Absolute: 0.6 10*3/uL (ref 0.1–1.0)
Monocytes Relative: 6 %
Neutro Abs: 5.7 10*3/uL (ref 1.7–7.7)
Neutrophils Relative %: 55 %
Platelets: 344 10*3/uL (ref 150–400)
RBC: 3.94 MIL/uL (ref 3.87–5.11)
RDW: 14.8 % (ref 11.5–15.5)
WBC: 10.3 10*3/uL (ref 4.0–10.5)
nRBC: 0 % (ref 0.0–0.2)

## 2021-08-29 LAB — URINALYSIS, ROUTINE W REFLEX MICROSCOPIC
Bilirubin Urine: NEGATIVE
Glucose, UA: NEGATIVE mg/dL
Hgb urine dipstick: NEGATIVE
Ketones, ur: NEGATIVE mg/dL
Nitrite: NEGATIVE
Protein, ur: 30 mg/dL — AB
Specific Gravity, Urine: 1.021 (ref 1.005–1.030)
WBC, UA: >50 WBC/hpf — ABNORMAL HIGH (ref 0–5)
pH: 5 (ref 5.0–8.0)

## 2021-08-29 LAB — TROPONIN I (HIGH SENSITIVITY)
Troponin I (High Sensitivity): 5 ng/L (ref <18)
Troponin I (High Sensitivity): 6 ng/L (ref <18)
Troponin I (High Sensitivity): 14 ng/L (ref <18)

## 2021-08-29 LAB — LIPASE, BLOOD: Lipase: 33 U/L (ref 11–51)

## 2021-08-29 MED ORDER — HEPARIN BOLUS VIA INFUSION
4000.0000 [IU] | Freq: Once | INTRAVENOUS | Status: AC
Start: 2021-08-29 — End: 2021-08-30
  Administered 2021-08-30: 4000 [IU] via INTRAVENOUS
  Filled 2021-08-29: qty 4000

## 2021-08-29 MED ORDER — HEPARIN (PORCINE) 25000 UT/250ML-% IV SOLN
850.0000 [IU]/h | INTRAVENOUS | Status: DC
  Administered 2021-08-30: 850 [IU]/h via INTRAVENOUS
  Filled 2021-08-29: qty 250

## 2021-08-29 NOTE — ED Triage Notes (Signed)
Pt states she was sent by her PCP but not completely sure why. States she has been having bilateral arm pain that goes to her chest when she is doing house work. Pain relieves with rest. No pain today. Denies SOB. Pt had labs drawn Thursday but unsure what was abnormal.

## 2021-08-29 NOTE — ED Provider Notes (Signed)
Emergency Medicine Provider Triage Evaluation Note  Vanessa Morrow , a 75 y.o. female  was evaluated in triage.  Pt complains of abnormal labs, per PCP. She is unsure which one. Recent hx of anorexia, "knot" in stomach, nausea. Chest pain with activity, resolves at rest. Pain radiates into next.   Review of Systems  Positive: Chest pain, nausea, abdominal pain Negative: Palpitations, fevers, chills, SOB  Physical Exam  BP 123/73 (BP Location: Left Arm)   Pulse 79   Temp 98.2 F (36.8 C) (Oral)   Resp 15   SpO2 100%  Gen:   Awake, no distress   Resp:  Normal effort  MSK:   Moves extremities without difficulty  Other:  RRR no m/r/g, Lungs CTAB  Medical Decision Making  Medically screening exam initiated at 1:11 PM.  Appropriate orders placed.  Vanessa Morrow was informed that the remainder of the evaluation will be completed by another provider, this initial triage assessment does not replace that evaluation, and the importance of remaining in the ED until their evaluation is complete.  This chart was dictated using voice recognition software, Dragon. Despite the best efforts of this provider to proofread and correct errors, errors may still occur which can change documentation meaning.    Emeline Darling, PA-C 08/29/21 1314    Daleen Bo, MD 08/29/21 1810

## 2021-08-29 NOTE — ED Notes (Signed)
Pt given turkey sandwich and a drink. ?
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
## 2021-08-30 ENCOUNTER — Other Ambulatory Visit (HOSPITAL_COMMUNITY): Payer: Medicare Other

## 2021-08-30 ENCOUNTER — Observation Stay (HOSPITAL_BASED_OUTPATIENT_CLINIC_OR_DEPARTMENT_OTHER): Payer: Medicare Other

## 2021-08-30 ENCOUNTER — Encounter (HOSPITAL_COMMUNITY): Payer: Self-pay | Admitting: Internal Medicine

## 2021-08-30 ENCOUNTER — Other Ambulatory Visit: Payer: Self-pay | Admitting: Physician Assistant

## 2021-08-30 DIAGNOSIS — R079 Chest pain, unspecified: Secondary | ICD-10-CM

## 2021-08-30 DIAGNOSIS — E1169 Type 2 diabetes mellitus with other specified complication: Secondary | ICD-10-CM | POA: Diagnosis not present

## 2021-08-30 DIAGNOSIS — N1832 Chronic kidney disease, stage 3b: Secondary | ICD-10-CM | POA: Diagnosis not present

## 2021-08-30 DIAGNOSIS — E782 Mixed hyperlipidemia: Secondary | ICD-10-CM

## 2021-08-30 DIAGNOSIS — R072 Precordial pain: Secondary | ICD-10-CM | POA: Diagnosis not present

## 2021-08-30 DIAGNOSIS — E1142 Type 2 diabetes mellitus with diabetic polyneuropathy: Secondary | ICD-10-CM | POA: Insufficient documentation

## 2021-08-30 DIAGNOSIS — E039 Hypothyroidism, unspecified: Secondary | ICD-10-CM | POA: Diagnosis not present

## 2021-08-30 DIAGNOSIS — I208 Other forms of angina pectoris: Secondary | ICD-10-CM

## 2021-08-30 DIAGNOSIS — K219 Gastro-esophageal reflux disease without esophagitis: Secondary | ICD-10-CM | POA: Diagnosis not present

## 2021-08-30 DIAGNOSIS — E876 Hypokalemia: Secondary | ICD-10-CM | POA: Diagnosis not present

## 2021-08-30 DIAGNOSIS — Z794 Long term (current) use of insulin: Secondary | ICD-10-CM | POA: Diagnosis not present

## 2021-08-30 DIAGNOSIS — I10 Essential (primary) hypertension: Secondary | ICD-10-CM | POA: Diagnosis not present

## 2021-08-30 DIAGNOSIS — R0789 Other chest pain: Secondary | ICD-10-CM | POA: Diagnosis not present

## 2021-08-30 DIAGNOSIS — M1A00X0 Idiopathic chronic gout, unspecified site, without tophus (tophi): Secondary | ICD-10-CM | POA: Insufficient documentation

## 2021-08-30 LAB — COMPREHENSIVE METABOLIC PANEL
ALT: 17 U/L (ref 0–44)
AST: 21 U/L (ref 15–41)
Albumin: 3.4 g/dL — ABNORMAL LOW (ref 3.5–5.0)
Alkaline Phosphatase: 50 U/L (ref 38–126)
Anion gap: 11 (ref 5–15)
BUN: 19 mg/dL (ref 8–23)
CO2: 28 mmol/L (ref 22–32)
Calcium: 9.3 mg/dL (ref 8.9–10.3)
Chloride: 101 mmol/L (ref 98–111)
Creatinine, Ser: 1.4 mg/dL — ABNORMAL HIGH (ref 0.44–1.00)
GFR, Estimated: 39 mL/min — ABNORMAL LOW (ref >60)
Glucose, Bld: 127 mg/dL — ABNORMAL HIGH (ref 70–99)
Potassium: 3 mmol/L — ABNORMAL LOW (ref 3.5–5.1)
Sodium: 140 mmol/L (ref 135–145)
Total Bilirubin: 0.6 mg/dL (ref 0.3–1.2)
Total Protein: 6.4 g/dL — ABNORMAL LOW (ref 6.5–8.1)

## 2021-08-30 LAB — LIPID PANEL
Cholesterol: 59 mg/dL (ref 0–200)
HDL: 27 mg/dL — ABNORMAL LOW (ref >40)
LDL Cholesterol: 19 mg/dL (ref 0–99)
Total CHOL/HDL Ratio: 2.2 RATIO
Triglycerides: 65 mg/dL (ref <150)
VLDL: 13 mg/dL (ref 0–40)

## 2021-08-30 LAB — BASIC METABOLIC PANEL
Anion gap: 8 (ref 5–15)
BUN: 18 mg/dL (ref 8–23)
CO2: 30 mmol/L (ref 22–32)
Calcium: 9 mg/dL (ref 8.9–10.3)
Chloride: 102 mmol/L (ref 98–111)
Creatinine, Ser: 1.35 mg/dL — ABNORMAL HIGH (ref 0.44–1.00)
GFR, Estimated: 41 mL/min — ABNORMAL LOW (ref >60)
Glucose, Bld: 138 mg/dL — ABNORMAL HIGH (ref 70–99)
Potassium: 3 mmol/L — ABNORMAL LOW (ref 3.5–5.1)
Sodium: 140 mmol/L (ref 135–145)

## 2021-08-30 LAB — RESP PANEL BY RT-PCR (FLU A&B, COVID) ARPGX2
Influenza A by PCR: NEGATIVE
Influenza B by PCR: NEGATIVE
SARS Coronavirus 2 by RT PCR: NEGATIVE

## 2021-08-30 LAB — CBC WITH DIFFERENTIAL/PLATELET
Abs Immature Granulocytes: 0.04 10*3/uL (ref 0.00–0.07)
Basophils Absolute: 0.1 10*3/uL (ref 0.0–0.1)
Basophils Relative: 1 %
Eosinophils Absolute: 0.2 10*3/uL (ref 0.0–0.5)
Eosinophils Relative: 2 %
HCT: 34 % — ABNORMAL LOW (ref 36.0–46.0)
Hemoglobin: 11.2 g/dL — ABNORMAL LOW (ref 12.0–15.0)
Immature Granulocytes: 0 %
Lymphocytes Relative: 35 %
Lymphs Abs: 4.4 10*3/uL — ABNORMAL HIGH (ref 0.7–4.0)
MCH: 30 pg (ref 26.0–34.0)
MCHC: 32.9 g/dL (ref 30.0–36.0)
MCV: 91.2 fL (ref 80.0–100.0)
Monocytes Absolute: 0.9 10*3/uL (ref 0.1–1.0)
Monocytes Relative: 7 %
Neutro Abs: 7.1 10*3/uL (ref 1.7–7.7)
Neutrophils Relative %: 55 %
Platelets: 319 10*3/uL (ref 150–400)
RBC: 3.73 MIL/uL — ABNORMAL LOW (ref 3.87–5.11)
RDW: 14.7 % (ref 11.5–15.5)
WBC: 12.7 10*3/uL — ABNORMAL HIGH (ref 4.0–10.5)
nRBC: 0 % (ref 0.0–0.2)

## 2021-08-30 LAB — MAGNESIUM: Magnesium: 2.2 mg/dL (ref 1.7–2.4)

## 2021-08-30 LAB — CBG MONITORING, ED: Glucose-Capillary: 123 mg/dL — ABNORMAL HIGH (ref 70–99)

## 2021-08-30 LAB — ECHOCARDIOGRAM COMPLETE
Area-P 1/2: 3.15 cm2
S' Lateral: 2.4 cm

## 2021-08-30 LAB — HEMOGLOBIN A1C
Hgb A1c MFr Bld: 6.8 % — ABNORMAL HIGH (ref 4.8–5.6)
Mean Plasma Glucose: 148.46 mg/dL

## 2021-08-30 LAB — TROPONIN I (HIGH SENSITIVITY): Troponin I (High Sensitivity): 9 ng/L (ref <18)

## 2021-08-30 MED ORDER — CHLORTHALIDONE 25 MG PO TABS
25.0000 mg | ORAL_TABLET | Freq: Every day | ORAL | Status: DC
  Filled 2021-08-30: qty 1

## 2021-08-30 MED ORDER — ASPIRIN 300 MG RE SUPP: 300.0000 mg | RECTAL | Status: AC

## 2021-08-30 MED ORDER — POTASSIUM CHLORIDE CRYS ER 20 MEQ PO TBCR
40.0000 meq | EXTENDED_RELEASE_TABLET | Freq: Once | ORAL | Status: AC
  Administered 2021-08-30: 40 meq via ORAL
  Filled 2021-08-30: qty 2

## 2021-08-30 MED ORDER — ASPIRIN EC 81 MG PO TBEC
81.0000 mg | DELAYED_RELEASE_TABLET | Freq: Every day | ORAL | Status: DC
  Administered 2021-08-30: 81 mg via ORAL
  Filled 2021-08-30: qty 1

## 2021-08-30 MED ORDER — LEVOTHYROXINE SODIUM 25 MCG PO TABS
50.0000 ug | ORAL_TABLET | Freq: Every day | ORAL | Status: DC
  Administered 2021-08-30: 50 ug via ORAL
  Filled 2021-08-30: qty 2

## 2021-08-30 MED ORDER — BENAZEPRIL HCL 40 MG PO TABS: 40.0000 mg | ORAL_TABLET | Freq: Every day | ORAL | Status: DC

## 2021-08-30 MED ORDER — ACETAMINOPHEN 325 MG PO TABS: 650.0000 mg | ORAL_TABLET | Freq: Four times a day (QID) | ORAL | Status: DC | PRN

## 2021-08-30 MED ORDER — TRAZODONE HCL 50 MG PO TABS
100.0000 mg | ORAL_TABLET | Freq: Every day | ORAL | Status: DC
  Administered 2021-08-30: 100 mg via ORAL
  Filled 2021-08-30: qty 2

## 2021-08-30 MED ORDER — HYDRALAZINE HCL 20 MG/ML IJ SOLN: 10.0000 mg | Freq: Four times a day (QID) | INTRAMUSCULAR | Status: DC | PRN

## 2021-08-30 MED ORDER — ASPIRIN 81 MG PO CHEW
324.0000 mg | CHEWABLE_TABLET | ORAL | Status: AC
  Administered 2021-08-30: 324 mg via ORAL
  Filled 2021-08-30: qty 4

## 2021-08-30 MED ORDER — AMLODIPINE BESYLATE 5 MG PO TABS: 10.0000 mg | ORAL_TABLET | Freq: Every day | ORAL | Status: DC

## 2021-08-30 MED ORDER — ACETAMINOPHEN 325 MG PO TABS: 650.0000 mg | ORAL_TABLET | ORAL | Status: DC | PRN

## 2021-08-30 MED ORDER — ONDANSETRON HCL 4 MG PO TABS: 4.0000 mg | ORAL_TABLET | Freq: Four times a day (QID) | ORAL | Status: DC | PRN

## 2021-08-30 MED ORDER — NITROGLYCERIN 0.4 MG SL SUBL: 0.4000 mg | SUBLINGUAL_TABLET | SUBLINGUAL | Status: DC | PRN

## 2021-08-30 MED ORDER — CYCLOBENZAPRINE HCL 10 MG PO TABS: 10.0000 mg | ORAL_TABLET | Freq: Three times a day (TID) | ORAL | Status: DC | PRN

## 2021-08-30 MED ORDER — LACTATED RINGERS IV SOLN: INTRAVENOUS | Status: AC

## 2021-08-30 MED ORDER — ATENOLOL-CHLORTHALIDONE 50-25 MG PO TABS: 1.0000 | ORAL_TABLET | Freq: Every day | ORAL | Status: DC

## 2021-08-30 MED ORDER — CHLORTHALIDONE 25 MG PO TABS
25.0000 mg | ORAL_TABLET | Freq: Every day | ORAL | Status: DC
  Administered 2021-08-30: 25 mg via ORAL
  Filled 2021-08-30: qty 1

## 2021-08-30 MED ORDER — ATENOLOL 25 MG PO TABS
50.0000 mg | ORAL_TABLET | Freq: Every day | ORAL | Status: DC
  Administered 2021-08-30: 50 mg via ORAL
  Filled 2021-08-30: qty 2

## 2021-08-30 MED ORDER — POLYETHYLENE GLYCOL 3350 17 G PO PACK: 17.0000 g | PACK | Freq: Every day | ORAL | Status: DC | PRN

## 2021-08-30 MED ORDER — ASPIRIN EC 81 MG PO TBEC: 81.0000 mg | DELAYED_RELEASE_TABLET | Freq: Every day | ORAL | Status: DC

## 2021-08-30 MED ORDER — CHLORTHALIDONE 25 MG PO TABS
25.0000 mg | ORAL_TABLET | Freq: Every evening | ORAL | Status: DC
Start: 2021-08-30 — End: 2021-08-30

## 2021-08-30 MED ORDER — PANTOPRAZOLE SODIUM 40 MG PO TBEC
40.0000 mg | DELAYED_RELEASE_TABLET | Freq: Every day | ORAL | Status: DC
  Administered 2021-08-30: 40 mg via ORAL
  Filled 2021-08-30: qty 1

## 2021-08-30 MED ORDER — ATENOLOL 25 MG PO TABS: 50.0000 mg | ORAL_TABLET | Freq: Every day | ORAL | Status: DC

## 2021-08-30 MED ORDER — INSULIN ASPART 100 UNIT/ML IJ SOLN
0.0000 [IU] | Freq: Three times a day (TID) | INTRAMUSCULAR | Status: DC
Start: 2021-08-30 — End: 2021-08-30

## 2021-08-30 MED ORDER — LEVOTHYROXINE SODIUM 75 MCG PO TABS: 75.0000 ug | ORAL_TABLET | Freq: Every day | ORAL | Status: DC

## 2021-08-30 MED ORDER — ACETAMINOPHEN 650 MG RE SUPP: 650.0000 mg | Freq: Four times a day (QID) | RECTAL | Status: DC | PRN

## 2021-08-30 MED ORDER — ONDANSETRON HCL 4 MG/2ML IJ SOLN: 4.0000 mg | Freq: Four times a day (QID) | INTRAMUSCULAR | Status: DC | PRN

## 2021-08-30 MED ORDER — GABAPENTIN 300 MG PO CAPS
300.0000 mg | ORAL_CAPSULE | Freq: Two times a day (BID) | ORAL | Status: DC
  Administered 2021-08-30 (×2): 300 mg via ORAL
  Filled 2021-08-30 (×2): qty 1

## 2021-08-30 MED ORDER — ALLOPURINOL 100 MG PO TABS: 100.0000 mg | ORAL_TABLET | Freq: Two times a day (BID) | ORAL | Status: DC | PRN

## 2021-08-30 MED ORDER — ROSUVASTATIN CALCIUM 20 MG PO TABS: 20.0000 mg | ORAL_TABLET | Freq: Every evening | ORAL | Status: DC

## 2021-08-30 MED ORDER — POTASSIUM CHLORIDE CRYS ER 20 MEQ PO TBCR: 40.0000 meq | EXTENDED_RELEASE_TABLET | ORAL | Status: DC
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
## 2021-08-30 NOTE — ED Notes (Signed)
Laiklyn Pilkenton daughter [phone] requesting an update

## 2021-08-30 NOTE — Assessment & Plan Note (Signed)
Continuing home regimen of daily PPI therapy.  

## 2021-08-30 NOTE — Assessment & Plan Note (Signed)
.   Resume patients home regimen of oral antihypertensives . Titrate antihypertensive regimen as necessary to achieve adequate BP control . PRN intravenous antihypertensives for excessively elevated blood pressure   

## 2021-08-30 NOTE — ED Notes (Signed)
Dr Doristine Bosworth wrote d/c orders pending BMP result. RN sent secure chat with results. Gave pt a [place] sandwich and drink with meds.
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
## 2021-08-30 NOTE — Assessment & Plan Note (Signed)
.   Resume home regimen of Synthroid 
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
## 2021-08-30 NOTE — Assessment & Plan Note (Deleted)
[id number]

## 2021-08-30 NOTE — Assessment & Plan Note (Signed)
Strict intake and output monitoring Creatinine near baseline Minimizing nephrotoxic agents as much as possible Serial chemistries to monitor renal function and electrolytes  

## 2021-08-30 NOTE — Discharge Summary (Addendum)
Physician Discharge Summary  Vanessa Morrow LKT:[id number] DOB: Jul 27, 1946 DOA: 08/29/2021  PCP: [name], MD  Admit date: 08/29/2021 Discharge date: 08/30/2021    Admitted From: Home Disposition: Home  Recommendations for Outpatient Follow-up:  Follow up with PCP in 1-2 weeks Please obtain BMP/CBC in one week Please follow up with your PCP on the following pending results: Unresulted Labs (From admission, onward)     Start     Ordered   08/30/21 3428  Basic metabolic panel  Once,   R        08/30/21 0806              Home Health: None Equipment/Devices: None  Discharge Condition: Stable CODE STATUS: Full code Diet recommendation: Cardiac  Subjective: Seen and examined this morning.  She has no more chest pain.  She has no other complaint.  She is ready to go home.  Following HPI is copied from our colleague admitting hospitalist Dr. Darrick Morrow H&P.  HPI:  75 year old female with past medical history of gastroesophageal reflux disease, hyperlipidemia, hypertension, hypothyroidism, non-insulin-dependent diabetes mellitus type 2, chronic kidney disease stage IIIa, peripheral polyneuropathy who presents to [facility] emergency department at the direction of her primary care provider with complaints of bilateral arm and chest discomfort.   Patient explains that she has been having episodes of chest and arm discomfort for the past several weeks.  Patient explains that typically when exerting herself in her garden she has noticed that she is developing chest tightness that seems to be associated with bilateral arm discomfort.  Symptoms have been episodic and are improved with rest.  As the weeks progressed patient's symptoms seem to become more intense.  However, when patient participates in other activities that require physical exertion she gets absolutely no chest discomfort at all.  Upon further questioning patient denies shortness of breath, paroxysmal  nocturnal dyspnea or pillow orthopnea.   Patient eventually presented to see her primary care provider.  During her evaluation there she was found to have "an elevated troponin" and was instructed to come to [facility] emergency department for evaluation.   Upon evaluation in the emergency department troponins were 5, 6 and 14.  EKG revealed no evidence of dynamic ST segment change.  ER provider was concerned for unstable angina and therefore initiated the patient on a heparin infusion.  ER provider also notified overnight cardiology provider and requested a consultation.  The hospitalist group was then called to assess the patient for admission to the hospital.    Brief/Interim Summary: In short, patient was admitted with atypical chest pain.  Cardiac enzymes are completely normal.  Transthoracic echo showed normal ejection fraction with grade 1 diastolic dysfunction but no wall motion abnormity.  This morning, patient has no more chest pain or any shortness of breath.  Her chest pain is likely musculoskeletal as she also has point tenderness at the left anterior chest.  She was seen by cardiology.  They recommended outpatient stress test and cleared the patient for discharge.  Patient is being discharged in stable condition.  Of note, her potassium was low, this was replenished before discharge.  Discharge Diagnoses:  Principal Problem:   Chest pain Active Problems:   Essential hypertension   Type 2 diabetes mellitus with diabetic polyneuropathy, with long-term current use of insulin (HCC)   Chronic kidney disease, stage 3b (HCC)   GERD without esophagitis   Hypothyroidism   Mixed diabetic hyperlipidemia associated with type 2 diabetes mellitus ([place])  Hypokalemia    Discharge Instructions   Allergies as of 08/30/2021   No Known Allergies      Medication List     TAKE these medications    acetaminophen 500 MG tablet Commonly known as: TYLENOL Take 1,000 mg by mouth  every 6 (six) hours as needed for mild pain.   allopurinol 100 MG tablet Commonly known as: ZYLOPRIM Take 100 mg by mouth 2 (two) times daily as needed (gout flare).   aspirin EC 81 MG tablet Take 81 mg by mouth daily. Swallow whole.   atenolol-chlorthalidone 50-25 MG tablet Commonly known as: TENORETIC Take 1 tablet by mouth daily.   CENTRUM PO Take 1 tablet by mouth daily.   cyclobenzaprine 10 MG tablet Commonly known as: FLEXERIL Take 10 mg by mouth 3 (three) times daily as needed for muscle spasms.   exenatide 10 MCG/0.04ML Sopn injection Commonly known as: BYETTA Inject 10 mcg into the skin 2 (two) times daily with a meal.   gabapentin 300 MG capsule Commonly known as: NEURONTIN Take 300 mg by mouth 2 (two) times daily.   glimepiride 2 MG tablet Commonly known as: AMARYL Take 2 mg by mouth daily with breakfast.   Janumet XR [phone] MG Tb24 Generic drug: SitaGLIPtin-MetFORMIN HCl Take 1 tablet by mouth every evening.   levothyroxine 75 MCG tablet Commonly known as: SYNTHROID Take 75 mcg by mouth daily.   pantoprazole 40 MG tablet Commonly known as: PROTONIX Take 40 mg by mouth daily as needed (heartburn/indigestion).   traZODone 50 MG tablet Commonly known as: DESYREL TAKE ONE TO TWO TABLETS AT 11PM What changed: See the new instructions.        Follow-up Information     Vanessa, [name], MD Follow up in 1 week(s).   Specialty: Internal Medicine Contact information: [place] [address] [phone]         Vanessa Harp, MD .   Specialties: Cardiology, Radiology Contact information: [address] [place] [address] [phone]                No Known Allergies  Consultations: Cardiology   Procedures/Studies: DG Chest 2 View  Result Date: 08/29/2021 CLINICAL DATA:  Intermittent chest pain. EXAM: CHEST - 2 VIEW COMPARISON:  Chest x-ray date April 14, 2004. FINDINGS: The heart size and  mediastinal contours are within normal limits. Normal pulmonary vascularity. Multiple surgical clips again noted in the left lung. No focal consolidation, pleural effusion, or pneumothorax. No acute osseous abnormality. IMPRESSION: 1. No acute cardiopulmonary disease. Electronically Signed   By: Titus Dubin M.D.   On: 08/29/2021 14:06   ECHOCARDIOGRAM COMPLETE  Result Date: 08/30/2021    ECHOCARDIOGRAM REPORT   Patient Name:   [name] Date of Exam: 08/30/2021 Medical Rec #:  [id number]     Height:       67.0 in Accession #:    [id number]    Weight:       202.8 lb Date of Birth:  01/13/1946     BSA:          2.034 m Patient Age:    21 years      BP:           116/56 mmHg Patient Gender: F             HR:           62 bpm. Exam Location:  Inpatient Procedure: 2D Echo, 3D Echo, Cardiac Doppler,  Color Doppler and Strain Analysis Indications:    Chest Pain R07.9  History:        Patient has no prior history of Echocardiogram examinations.                 Risk Factors:Hypertension, Diabetes and Dyslipidemia. Chronic                 kidney disease.  Sonographer:    Darlina Sicilian RDCS Referring Phys: [id number] [place]  1. Left ventricular ejection fraction, by estimation, is 55 to 60%. Left ventricular ejection fraction by 3D volume is 57 %. The left ventricle has normal function. The left ventricle has no regional wall motion abnormalities. Left ventricular diastolic  parameters are consistent with Grade I diastolic dysfunction (impaired relaxation). The average left ventricular global longitudinal strain is -19.7 %. The global longitudinal strain is normal.  2. Right ventricular systolic function is normal. The right ventricular size is normal. Tricuspid regurgitation signal is inadequate for assessing PA pressure.  3. The mitral valve is grossly normal. Trivial mitral valve regurgitation. No evidence of mitral stenosis.  4. The aortic valve is tricuspid. Aortic valve regurgitation is  not visualized. No aortic stenosis is present.  5. The inferior vena cava is normal in size with greater than 50% respiratory variability, suggesting right atrial pressure of 3 mmHg. FINDINGS  Left Ventricle: Left ventricular ejection fraction, by estimation, is 55 to 60%. Left ventricular ejection fraction by 3D volume is 57 %. The left ventricle has normal function. The left ventricle has no regional wall motion abnormalities. The average left ventricular global longitudinal strain is -19.7 %. The global longitudinal strain is normal. The left ventricular internal cavity size was normal in size. There is no left ventricular hypertrophy. Left ventricular diastolic parameters are consistent  with Grade I diastolic dysfunction (impaired relaxation). Right Ventricle: The right ventricular size is normal. No increase in right ventricular wall thickness. Right ventricular systolic function is normal. Tricuspid regurgitation signal is inadequate for assessing PA pressure. Left Atrium: Left atrial size was normal in size. Right Atrium: Right atrial size was normal in size. Pericardium: Trivial pericardial effusion is present. Presence of pericardial fat pad. Mitral Valve: The mitral valve is grossly normal. Trivial mitral valve regurgitation. No evidence of mitral valve stenosis. Tricuspid Valve: The tricuspid valve is grossly normal. Tricuspid valve regurgitation is trivial. No evidence of tricuspid stenosis. Aortic Valve: The aortic valve is tricuspid. Aortic valve regurgitation is not visualized. No aortic stenosis is present. Pulmonic Valve: The pulmonic valve was grossly normal. Pulmonic valve regurgitation is trivial. No evidence of pulmonic stenosis. Aorta: The aortic root and ascending aorta are structurally normal, with no evidence of dilitation. Venous: The right lower pulmonary vein is normal. The inferior vena cava is normal in size with greater than 50% respiratory variability, suggesting right atrial  pressure of 3 mmHg. IAS/Shunts: The atrial septum is grossly normal.  LEFT VENTRICLE PLAX 2D LVIDd:         4.70 cm         Diastology LVIDs:         2.40 cm         LV e' medial:    5.68 cm/s LV PW:         1.00 cm         LV E/e' medial:  12.7 LV IVS:        1.20 cm         LV e' lateral:  8.51 cm/s LVOT diam:     2.00 cm         LV E/e' lateral: 8.5 LV SV:         64 LV SV Index:   32              2D LVOT Area:     3.14 cm        Longitudinal                                Strain                                2D Strain GLS  -19.7 %                                Avg:                                 3D Volume EF                                LV 3D EF:    Left                                             ventricul                                             ar                                             ejection                                             fraction                                             by 3D                                             volume is                                             57 %.                                 3D Volume EF:  3D EF:        57 %                                LV EDV:       146 ml                                LV ESV:       63 ml                                LV SV:        84 ml RIGHT VENTRICLE RV S prime:     11.50 cm/s TAPSE (M-mode): 2.3 cm LEFT ATRIUM             Index        RIGHT ATRIUM           Index LA diam:        3.40 cm 1.67 cm/m   RA Area:     11.20 cm LA Vol (A2C):   19.2 ml 9.44 ml/m   RA Volume:   20.40 ml  10.03 ml/m LA Vol (A4C):   23.8 ml 11.70 ml/m LA Biplane Vol: 21.6 ml 10.62 ml/m  AORTIC VALVE LVOT Vmax:   79.80 cm/s LVOT Vmean:  60.400 cm/s LVOT VTI:    0.205 m  AORTA Ao Root diam: 3.10 cm Ao Asc diam:  3.30 cm MITRAL VALVE MV Area (PHT): 3.15 cm    SHUNTS MV Decel Time: 241 msec    Systemic VTI:  0.20 m MV E velocity: 72.35 cm/s  Systemic Diam: 2.00 cm MV A velocity: 85.90 cm/s MV E/A ratio:  0.84  Eleonore Chiquito MD Electronically signed by Eleonore Chiquito MD Signature Date/Time: 08/30/2021/10:08:17 AM    Final      Discharge Exam: Vitals:   08/30/21 0454 08/30/21 0652  BP: (!) 123/52 (!) 116/56  Pulse: 63 62  Resp: 17 13  Temp:    SpO2: 96% 96%   Vitals:   08/29/21 2151 08/30/21 0101 08/30/21 0454 08/30/21 0652  BP: (!) 164/68 (!) 158/63 (!) 123/52 (!) 116/56  Pulse: 66 67 63 62  Resp: 18 16 17 13   Temp:      TempSrc:      SpO2: 100% 100% 96% 96%    General: Pt is alert, awake, not in acute distress Cardiovascular: RRR, S1/S2 +, no rubs, no gallops Respiratory: CTA bilaterally, no wheezing, no rhonchi, point tenderness at left anterior chest. Abdominal: Soft, NT, ND, bowel sounds + Extremities: no edema, no cyanosis    The results of significant diagnostics from this hospitalization (including imaging, microbiology, ancillary and laboratory) are listed below for reference.     Microbiology: Recent Results (from the past 240 hour(s))  Resp Panel by RT-PCR (Flu A&B, Covid) Nasopharyngeal Swab     Status: None   Collection Time: 08/30/21  1:10 AM   Specimen: Nasopharyngeal Swab; Nasopharyngeal(NP) swabs in vial transport medium  Result Value Ref Range Status   SARS Coronavirus 2 by RT PCR NEGATIVE NEGATIVE Final    Comment: (NOTE) SARS-CoV-2 target nucleic acids are NOT DETECTED.  The SARS-CoV-2 RNA is generally detectable in upper respiratory specimens during the acute phase of infection. The lowest concentration of SARS-CoV-2 viral copies this assay can detect  is 138 copies/mL. A negative result does not preclude SARS-Cov-2 infection and should not be used as the sole basis for treatment or other patient management decisions. A negative result may occur with  improper specimen collection/handling, submission of specimen other than nasopharyngeal swab, presence of viral mutation(s) within the areas targeted by this assay, and inadequate number of  viral copies(<138 copies/mL). A negative result must be combined with clinical observations, patient history, and epidemiological information. The expected result is Negative.  Fact Sheet for Patients:  EntrepreneurPulse.com.au  Fact Sheet for Healthcare Providers:  IncredibleEmployment.be  This test is no t yet approved or cleared by the [place] FDA and  has been authorized for detection and/or diagnosis of SARS-CoV-2 by FDA under an Emergency Use Authorization (EUA). This EUA will remain  in effect (meaning this test can be used) for the duration of the COVID-19 declaration under Section 564(b)(1) of the Act, 21 U.S.C.section 360bbb-3(b)(1), unless the authorization is terminated  or revoked sooner.       Influenza A by PCR NEGATIVE NEGATIVE Final   Influenza B by PCR NEGATIVE NEGATIVE Final    Comment: (NOTE) The Xpert Xpress SARS-CoV-2/FLU/RSV plus assay is intended as an aid in the diagnosis of influenza from Nasopharyngeal swab specimens and should not be used as a sole basis for treatment. Nasal washings and aspirates are unacceptable for Xpert Xpress SARS-CoV-2/FLU/RSV testing.  Fact Sheet for Patients: EntrepreneurPulse.com.au  Fact Sheet for Healthcare Providers: IncredibleEmployment.be  This test is not yet approved or cleared by the [place] FDA and has been authorized for detection and/or diagnosis of SARS-CoV-2 by FDA under an Emergency Use Authorization (EUA). This EUA will remain in effect (meaning this test can be used) for the duration of the COVID-19 declaration under Section 564(b)(1) of the Act, 21 U.S.C. section 360bbb-3(b)(1), unless the authorization is terminated or revoked.  Performed at [facility], [place] [address] [place], [place] [address]      Labs: BNP (last 3 results) No results for input(s): BNP in the last 8760 hours. Basic Metabolic  Panel: Recent Labs  Lab 08/29/21 1316 08/30/21 0319  NA 138 140  K 3.2* 3.0*  CL 99 101  CO2 28 28  GLUCOSE 167* 127*  BUN 18 19  CREATININE 1.58* 1.40*  CALCIUM 9.4 9.3  MG  --  2.2   Liver Function Tests: Recent Labs  Lab 08/29/21 1316 08/30/21 0319  AST 26 21  ALT 18 17  ALKPHOS 55 50  BILITOT 0.5 0.6  PROT 6.8 6.4*  ALBUMIN 3.5 3.4*   Recent Labs  Lab 08/29/21 1316  LIPASE 33   No results for input(s): AMMONIA in the last 168 hours. CBC: Recent Labs  Lab 08/29/21 1316 08/30/21 0319  WBC 10.3 12.7*  NEUTROABS 5.7 7.1  HGB 11.9* 11.2*  HCT 36.7 34.0*  MCV 93.1 91.2  PLT 344 319   Cardiac Enzymes: No results for input(s): CKTOTAL, CKMB, CKMBINDEX, TROPONINI in the last 168 hours. BNP: Invalid input(s): POCBNP CBG: Recent Labs  Lab 08/30/21 0749  GLUCAP 123*   D-Dimer No results for input(s): DDIMER in the last 72 hours. Hgb A1c Recent Labs    08/30/21 0319  HGBA1C 6.8*   Lipid Profile Recent Labs    08/30/21 0319  CHOL 59  HDL 27*  LDLCALC 19  TRIG 65  CHOLHDL 2.2   Thyroid function studies No results for input(s): TSH, T4TOTAL, T3FREE, THYROIDAB in the last 72 hours.  Invalid input(s): FREET3  Anemia work up No results for input(s): VITAMINB12, FOLATE, FERRITIN, TIBC, IRON, RETICCTPCT in the last 72 hours. Urinalysis    Component Value Date/Time   COLORURINE YELLOW 08/29/2021 2034   APPEARANCEUR CLOUDY (A) 08/29/2021 2034   LABSPEC 1.021 08/29/2021 2034   PHURINE 5.0 08/29/2021 2034   GLUCOSEU NEGATIVE 08/29/2021 2034   HGBUR NEGATIVE 08/29/2021 2034   BILIRUBINUR NEGATIVE 08/29/2021 2034   Lead NEGATIVE 08/29/2021 2034   PROTEINUR 30 (A) 08/29/2021 2034   NITRITE NEGATIVE 08/29/2021 2034   LEUKOCYTESUR LARGE (A) 08/29/2021 2034   Sepsis Labs Invalid input(s): PROCALCITONIN,  WBC,  LACTICIDVEN Microbiology Recent Results (from the past 240 hour(s))  Resp Panel by RT-PCR (Flu A&B, Covid) Nasopharyngeal Swab      Status: None   Collection Time: 08/30/21  1:10 AM   Specimen: Nasopharyngeal Swab; Nasopharyngeal(NP) swabs in vial transport medium  Result Value Ref Range Status   SARS Coronavirus 2 by RT PCR NEGATIVE NEGATIVE Final    Comment: (NOTE) SARS-CoV-2 target nucleic acids are NOT DETECTED.  The SARS-CoV-2 RNA is generally detectable in upper respiratory specimens during the acute phase of infection. The lowest concentration of SARS-CoV-2 viral copies this assay can detect is 138 copies/mL. A negative result does not preclude SARS-Cov-2 infection and should not be used as the sole basis for treatment or other patient management decisions. A negative result may occur with  improper specimen collection/handling, submission of specimen other than nasopharyngeal swab, presence of viral mutation(s) within the areas targeted by this assay, and inadequate number of viral copies(<138 copies/mL). A negative result must be combined with clinical observations, patient history, and epidemiological information. The expected result is Negative.  Fact Sheet for Patients:  EntrepreneurPulse.com.au  Fact Sheet for Healthcare Providers:  IncredibleEmployment.be  This test is no t yet approved or cleared by the [place] FDA and  has been authorized for detection and/or diagnosis of SARS-CoV-2 by FDA under an Emergency Use Authorization (EUA). This EUA will remain  in effect (meaning this test can be used) for the duration of the COVID-19 declaration under Section 564(b)(1) of the Act, 21 U.S.C.section 360bbb-3(b)(1), unless the authorization is terminated  or revoked sooner.       Influenza A by PCR NEGATIVE NEGATIVE Final   Influenza B by PCR NEGATIVE NEGATIVE Final    Comment: (NOTE) The Xpert Xpress SARS-CoV-2/FLU/RSV plus assay is intended as an aid in the diagnosis of influenza from Nasopharyngeal swab specimens and should not be used as a sole basis  for treatment. Nasal washings and aspirates are unacceptable for Xpert Xpress SARS-CoV-2/FLU/RSV testing.  Fact Sheet for Patients: EntrepreneurPulse.com.au  Fact Sheet for Healthcare Providers: IncredibleEmployment.be  This test is not yet approved or cleared by the [place] FDA and has been authorized for detection and/or diagnosis of SARS-CoV-2 by FDA under an Emergency Use Authorization (EUA). This EUA will remain in effect (meaning this test can be used) for the duration of the COVID-19 declaration under Section 564(b)(1) of the Act, 21 U.S.C. section 360bbb-3(b)(1), unless the authorization is terminated or revoked.  Performed at [facility], [place] [address] [place], [place] [address]      Time coordinating discharge: Over 30 minutes  SIGNED:   Darliss Cheney, MD  [facility] 08/30/2021, 11:09 AM  If 7PM-7AM, please contact night-coverage [email or web address]

## 2021-08-30 NOTE — Assessment & Plan Note (Signed)
.   Continuing home regimen of lipid lowering therapy.  

## 2021-08-30 NOTE — Progress Notes (Signed)
  Echocardiogram 2D Echocardiogram has been performed.  Darlina Sicilian M 08/30/2021, 9:56 AM

## 2021-08-30 NOTE — Assessment & Plan Note (Signed)
.   Patient been placed on Accu-Cheks before every meal and nightly with sliding scale insulin . Holding home regimen of hypoglycemics . Hemoglobin A1C ordered

## 2021-08-30 NOTE — Assessment & Plan Note (Signed)
·   Replacing with potassium chloride °· Evaluating for concurrent hypomagnesemia  °· Monitoring potassium levels with serial chemistries. ° °

## 2021-08-30 NOTE — Assessment & Plan Note (Signed)
   Patient presenting from primary care provider office with several week history of episodic chest discomfort   Chest discomfort is extremely atypical however, typically occurring when patient is performing physical activities using her left arm in particular inducing shoulder pain that radiates to the chest.  Patient can perform other physically strenuous feats without any chest pain whatsoever  Furthermore chest discomfort is reproducible on exam  Initial troponin unremarkable  I feel that unstable angina is not likely at this time, will discontinue heparin infusion started by the emergency department provider  Continue to monitor patient on telemetry  Cycling cardiac enzymes  Echocardiogram in the morning  Cardiology consultation in the morning for consideration of any further noninvasive ischemic assessment

## 2021-08-30 NOTE — ED Notes (Signed)
Pt verbalized understanding of d/c instructions, meds and followup care. Denies questions. VSS, no distress noted. W/Cto exit with all belongings, daughter picking pt up.

## 2021-09-05 ENCOUNTER — Telehealth (HOSPITAL_COMMUNITY): Payer: Self-pay

## 2021-09-05 NOTE — Telephone Encounter (Signed)
Detailed instructions left on the patient's answering machine. Asked to call back with any questions. S.Adalay Azucena EMTP 

## 2021-09-06 ENCOUNTER — Ambulatory Visit (HOSPITAL_COMMUNITY): Payer: Medicare Other | Attending: Internal Medicine

## 2021-09-06 ENCOUNTER — Other Ambulatory Visit: Payer: Self-pay

## 2021-09-06 DIAGNOSIS — I208 Other forms of angina pectoris: Secondary | ICD-10-CM | POA: Diagnosis not present

## 2021-09-06 LAB — MYOCARDIAL PERFUSION IMAGING
Base ST Depression (mm): 0 mm
LV dias vol: 84 mL (ref 46–106)
LV sys vol: 28 mL
Nuc Stress EF: 67 %
Peak HR: 81 {beats}/min
Rest HR: 65 {beats}/min
Rest Nuclear Isotope Dose: 9.8 mCi
SDS: 2
SRS: 0
SSS: 2
ST Depression (mm): 0 mm
Stress Nuclear Isotope Dose: 32.5 mCi
TID: 1.1

## 2021-09-06 MED ORDER — TECHNETIUM TC 99M TETROFOSMIN IV KIT
32.5000 | PACK | Freq: Once | INTRAVENOUS | Status: AC | PRN
  Administered 2021-09-06: 32.5 via INTRAVENOUS
  Filled 2021-09-06: qty 33

## 2021-09-06 MED ORDER — TECHNETIUM TC 99M TETROFOSMIN IV KIT
9.8000 | PACK | Freq: Once | INTRAVENOUS | Status: AC | PRN
  Administered 2021-09-06: 9.8 via INTRAVENOUS
  Filled 2021-09-06: qty 10

## 2021-09-06 MED ORDER — REGADENOSON 0.4 MG/5ML IV SOLN
0.4000 mg | Freq: Once | INTRAVENOUS | Status: AC
  Administered 2021-09-06: 0.4 mg via INTRAVENOUS

## 2021-09-07 ENCOUNTER — Other Ambulatory Visit: Payer: Self-pay | Admitting: Pulmonary Disease

## 2021-09-07 DIAGNOSIS — Z0001 Encounter for general adult medical examination with abnormal findings: Secondary | ICD-10-CM | POA: Diagnosis not present

## 2021-09-07 DIAGNOSIS — E1140 Type 2 diabetes mellitus with diabetic neuropathy, unspecified: Secondary | ICD-10-CM | POA: Diagnosis not present

## 2021-09-07 DIAGNOSIS — K219 Gastro-esophageal reflux disease without esophagitis: Secondary | ICD-10-CM | POA: Diagnosis not present

## 2021-09-07 DIAGNOSIS — E782 Mixed hyperlipidemia: Secondary | ICD-10-CM | POA: Diagnosis not present

## 2021-09-07 DIAGNOSIS — N1831 Chronic kidney disease, stage 3a: Secondary | ICD-10-CM | POA: Diagnosis not present

## 2021-09-07 DIAGNOSIS — I10 Essential (primary) hypertension: Secondary | ICD-10-CM | POA: Diagnosis not present

## 2021-09-07 DIAGNOSIS — E1165 Type 2 diabetes mellitus with hyperglycemia: Secondary | ICD-10-CM | POA: Diagnosis not present

## 2021-09-07 DIAGNOSIS — Z9189 Other specified personal risk factors, not elsewhere classified: Secondary | ICD-10-CM | POA: Diagnosis not present

## 2021-09-07 DIAGNOSIS — E039 Hypothyroidism, unspecified: Secondary | ICD-10-CM | POA: Diagnosis not present

## 2021-09-07 NOTE — Telephone Encounter (Signed)
Refill sent Please make routine OV to reassess if this medication is needed long term. Further FU after that can be with PCP

## 2021-09-07 NOTE — Telephone Encounter (Signed)
RA please advise on refill of medication pended.    Last refill was 02/22/2021 #180 with 1 refill  Last OV was 03/03/2021 and no pending appts with you at this time.  thanks

## 2021-09-14 DIAGNOSIS — E1165 Type 2 diabetes mellitus with hyperglycemia: Secondary | ICD-10-CM | POA: Diagnosis not present

## 2021-09-14 DIAGNOSIS — N1831 Chronic kidney disease, stage 3a: Secondary | ICD-10-CM | POA: Diagnosis not present

## 2021-09-14 DIAGNOSIS — E039 Hypothyroidism, unspecified: Secondary | ICD-10-CM | POA: Diagnosis not present

## 2021-09-14 DIAGNOSIS — M25512 Pain in left shoulder: Secondary | ICD-10-CM | POA: Diagnosis not present

## 2021-09-14 DIAGNOSIS — I10 Essential (primary) hypertension: Secondary | ICD-10-CM | POA: Diagnosis not present

## 2021-09-14 DIAGNOSIS — Z9189 Other specified personal risk factors, not elsewhere classified: Secondary | ICD-10-CM | POA: Diagnosis not present

## 2021-09-14 DIAGNOSIS — K219 Gastro-esophageal reflux disease without esophagitis: Secondary | ICD-10-CM | POA: Diagnosis not present

## 2021-09-14 DIAGNOSIS — E782 Mixed hyperlipidemia: Secondary | ICD-10-CM | POA: Diagnosis not present

## 2021-09-14 DIAGNOSIS — E1140 Type 2 diabetes mellitus with diabetic neuropathy, unspecified: Secondary | ICD-10-CM | POA: Diagnosis not present

## 2021-09-17 DIAGNOSIS — Z1231 Encounter for screening mammogram for malignant neoplasm of breast: Secondary | ICD-10-CM | POA: Diagnosis not present
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
## 2021-10-04 ENCOUNTER — Ambulatory Visit: Payer: Self-pay

## 2021-10-04 ENCOUNTER — Ambulatory Visit: Payer: Medicare Other | Admitting: Student

## 2021-10-04 ENCOUNTER — Ambulatory Visit (INDEPENDENT_AMBULATORY_CARE_PROVIDER_SITE_OTHER): Payer: Medicare Other | Admitting: Orthopaedic Surgery

## 2021-10-04 ENCOUNTER — Other Ambulatory Visit: Payer: Self-pay

## 2021-10-04 DIAGNOSIS — M25512 Pain in left shoulder: Secondary | ICD-10-CM | POA: Diagnosis not present

## 2021-10-04 DIAGNOSIS — G8929 Other chronic pain: Secondary | ICD-10-CM | POA: Diagnosis not present

## 2021-10-04 DIAGNOSIS — M79601 Pain in right arm: Secondary | ICD-10-CM | POA: Diagnosis not present

## 2021-10-04 DIAGNOSIS — M79602 Pain in left arm: Secondary | ICD-10-CM

## 2021-10-04 MED ORDER — LIDOCAINE HCL 2 % IJ SOLN
2.0000 mL | INTRAMUSCULAR | Status: AC | PRN
  Administered 2021-10-04: 2 mL

## 2021-10-04 MED ORDER — BUPIVACAINE HCL 0.25 % IJ SOLN
2.0000 mL | INTRAMUSCULAR | Status: AC | PRN
  Administered 2021-10-04: 2 mL via INTRA_ARTICULAR

## 2021-10-04 MED ORDER — METHYLPREDNISOLONE ACETATE 40 MG/ML IJ SUSP
40.0000 mg | INTRAMUSCULAR | Status: AC | PRN
  Administered 2021-10-04: 40 mg via INTRA_ARTICULAR
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
## 2021-10-25 DIAGNOSIS — M47816 Spondylosis without myelopathy or radiculopathy, lumbar region: Secondary | ICD-10-CM | POA: Diagnosis not present

## 2021-12-06 DIAGNOSIS — E1165 Type 2 diabetes mellitus with hyperglycemia: Secondary | ICD-10-CM | POA: Diagnosis not present

## 2021-12-06 DIAGNOSIS — B3731 Acute candidiasis of vulva and vagina: Secondary | ICD-10-CM | POA: Diagnosis not present

## 2021-12-06 DIAGNOSIS — N1831 Chronic kidney disease, stage 3a: Secondary | ICD-10-CM | POA: Diagnosis not present

## 2021-12-06 DIAGNOSIS — Z9189 Other specified personal risk factors, not elsewhere classified: Secondary | ICD-10-CM | POA: Diagnosis not present

## 2021-12-06 DIAGNOSIS — K219 Gastro-esophageal reflux disease without esophagitis: Secondary | ICD-10-CM | POA: Diagnosis not present

## 2021-12-06 DIAGNOSIS — E782 Mixed hyperlipidemia: Secondary | ICD-10-CM | POA: Diagnosis not present

## 2021-12-06 DIAGNOSIS — I10 Essential (primary) hypertension: Secondary | ICD-10-CM | POA: Diagnosis not present

## 2021-12-06 DIAGNOSIS — E1140 Type 2 diabetes mellitus with diabetic neuropathy, unspecified: Secondary | ICD-10-CM | POA: Diagnosis not present

## 2021-12-06 DIAGNOSIS — E039 Hypothyroidism, unspecified: Secondary | ICD-10-CM | POA: Diagnosis not present

## 2021-12-09 DIAGNOSIS — R0602 Shortness of breath: Secondary | ICD-10-CM | POA: Diagnosis not present

## 2022-01-16 DIAGNOSIS — H43811 Vitreous degeneration, right eye: Secondary | ICD-10-CM | POA: Diagnosis not present

## 2022-01-16 DIAGNOSIS — E119 Type 2 diabetes mellitus without complications: Secondary | ICD-10-CM | POA: Diagnosis not present

## 2022-01-16 DIAGNOSIS — H538 Other visual disturbances: Secondary | ICD-10-CM | POA: Diagnosis not present
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
## 2022-03-02 ENCOUNTER — Ambulatory Visit: Payer: Medicare Other | Admitting: Student

## 2022-03-02 ENCOUNTER — Other Ambulatory Visit: Payer: Self-pay | Admitting: Pulmonary Disease

## 2022-03-07 ENCOUNTER — Encounter: Payer: Self-pay | Admitting: Student

## 2022-03-15 ENCOUNTER — Ambulatory Visit: Payer: Medicare Other | Admitting: Orthopaedic Surgery

## 2022-03-21 ENCOUNTER — Encounter: Payer: Self-pay | Admitting: Orthopaedic Surgery

## 2022-03-21 ENCOUNTER — Ambulatory Visit (INDEPENDENT_AMBULATORY_CARE_PROVIDER_SITE_OTHER): Payer: Medicare Other | Admitting: Orthopaedic Surgery

## 2022-03-21 ENCOUNTER — Ambulatory Visit (INDEPENDENT_AMBULATORY_CARE_PROVIDER_SITE_OTHER): Payer: Medicare Other

## 2022-03-21 VITALS — Ht 66.0 in | Wt 197.0 lb

## 2022-03-21 DIAGNOSIS — G8929 Other chronic pain: Secondary | ICD-10-CM

## 2022-03-21 DIAGNOSIS — M545 Low back pain, unspecified: Secondary | ICD-10-CM

## 2022-03-21 MED ORDER — METHOCARBAMOL 500 MG PO TABS: 500.0000 mg | ORAL_TABLET | Freq: Two times a day (BID) | ORAL | 1 refills | Status: DC | PRN

## 2022-03-21 MED ORDER — PREDNISONE 5 MG (21) PO TBPK: ORAL_TABLET | ORAL | 0 refills | Status: AC
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
## 2022-03-27 ENCOUNTER — Encounter: Payer: Self-pay | Admitting: Physical Medicine and Rehabilitation

## 2022-03-27 ENCOUNTER — Ambulatory Visit (INDEPENDENT_AMBULATORY_CARE_PROVIDER_SITE_OTHER): Payer: Medicare Other | Admitting: Physical Medicine and Rehabilitation

## 2022-03-27 VITALS — BP 146/85 | HR 76

## 2022-03-27 DIAGNOSIS — G8929 Other chronic pain: Secondary | ICD-10-CM | POA: Diagnosis not present

## 2022-03-27 DIAGNOSIS — M545 Low back pain, unspecified: Secondary | ICD-10-CM | POA: Diagnosis not present

## 2022-03-27 DIAGNOSIS — M47816 Spondylosis without myelopathy or radiculopathy, lumbar region: Secondary | ICD-10-CM | POA: Diagnosis not present

## 2022-03-27 NOTE — Progress Notes (Unsigned)
Pt state lower back pain that travels down her left leg. Pt state standing makes the pain worse. Pt state she rest and takes over the counter pain meds to help ease her pain.  Numeric Pain Rating Scale and Functional Assessment Average Pain 10 Pain Right Now 7 My pain is constant, burning, dull, tingling, and aching Pain is worse with: standing and some activites Pain improves with: rest and medication   In the last MONTH (on 0-10 scale) has pain interfered with the following?  1. General activity like being  able to carry out your everyday physical activities such as walking, climbing stairs, carrying groceries, or moving a chair?  Rating(5)  2. Relation with others like being able to carry out your usual social activities and roles such as  activities at home, at work and in your community. Rating(6)  3. Enjoyment of life such that you have  been bothered by emotional problems such as feeling anxious, depressed or irritable?  Rating(7)
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
## 2022-04-11 DIAGNOSIS — N183 Chronic kidney disease, stage 3 unspecified: Secondary | ICD-10-CM | POA: Diagnosis not present

## 2022-04-11 DIAGNOSIS — I129 Hypertensive chronic kidney disease with stage 1 through stage 4 chronic kidney disease, or unspecified chronic kidney disease: Secondary | ICD-10-CM | POA: Diagnosis not present

## 2022-04-11 DIAGNOSIS — N390 Urinary tract infection, site not specified: Secondary | ICD-10-CM | POA: Diagnosis not present

## 2022-04-11 DIAGNOSIS — E1122 Type 2 diabetes mellitus with diabetic chronic kidney disease: Secondary | ICD-10-CM | POA: Diagnosis not present

## 2022-04-17 ENCOUNTER — Telehealth: Payer: Self-pay | Admitting: Physical Medicine and Rehabilitation

## 2022-04-17 NOTE — Telephone Encounter (Signed)
Patient's daughter called. She needs to [facility] 7/10 appointment with Dr. Ernestina Patches

## 2022-04-24 DIAGNOSIS — I10 Essential (primary) hypertension: Secondary | ICD-10-CM | POA: Diagnosis not present

## 2022-04-24 DIAGNOSIS — E782 Mixed hyperlipidemia: Secondary | ICD-10-CM | POA: Diagnosis not present

## 2022-04-24 DIAGNOSIS — E039 Hypothyroidism, unspecified: Secondary | ICD-10-CM | POA: Diagnosis not present

## 2022-04-24 DIAGNOSIS — E1165 Type 2 diabetes mellitus with hyperglycemia: Secondary | ICD-10-CM | POA: Diagnosis not present

## 2022-04-24 DIAGNOSIS — R1084 Generalized abdominal pain: Secondary | ICD-10-CM | POA: Diagnosis not present

## 2022-04-26 DIAGNOSIS — N183 Chronic kidney disease, stage 3 unspecified: Secondary | ICD-10-CM | POA: Diagnosis not present

## 2022-04-27 ENCOUNTER — Ambulatory Visit (INDEPENDENT_AMBULATORY_CARE_PROVIDER_SITE_OTHER): Payer: Medicare Other | Admitting: Orthopaedic Surgery

## 2022-04-27 ENCOUNTER — Encounter: Payer: Self-pay | Admitting: Orthopaedic Surgery

## 2022-04-27 DIAGNOSIS — M25512 Pain in left shoulder: Secondary | ICD-10-CM

## 2022-04-27 DIAGNOSIS — G8929 Other chronic pain: Secondary | ICD-10-CM

## 2022-04-27 MED ORDER — TRAMADOL HCL 50 MG PO TABS: 50.0000 mg | ORAL_TABLET | Freq: Two times a day (BID) | ORAL | 2 refills | Status: AC | PRN
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
## 2022-04-28 MED ORDER — BUPIVACAINE HCL 0.25 % IJ SOLN
2.0000 mL | INTRAMUSCULAR | Status: AC | PRN
  Administered 2022-04-27: 2 mL via INTRA_ARTICULAR

## 2022-04-28 MED ORDER — LIDOCAINE HCL 2 % IJ SOLN
2.0000 mL | INTRAMUSCULAR | Status: AC | PRN
  Administered 2022-04-27: 2 mL

## 2022-04-28 MED ORDER — METHYLPREDNISOLONE ACETATE 40 MG/ML IJ SUSP
40.0000 mg | INTRAMUSCULAR | Status: AC | PRN
  Administered 2022-04-27: 40 mg via INTRA_ARTICULAR

## 2022-05-02 ENCOUNTER — Ambulatory Visit: Payer: Medicare Other | Admitting: Orthopaedic Surgery

## 2022-05-03 ENCOUNTER — Ambulatory Visit: Payer: Self-pay

## 2022-05-03 ENCOUNTER — Encounter: Payer: Self-pay | Admitting: Physical Medicine and Rehabilitation

## 2022-05-03 ENCOUNTER — Ambulatory Visit (INDEPENDENT_AMBULATORY_CARE_PROVIDER_SITE_OTHER): Payer: Medicare Other | Admitting: Physical Medicine and Rehabilitation

## 2022-05-03 VITALS — BP 162/94 | HR 76

## 2022-05-03 DIAGNOSIS — G8929 Other chronic pain: Secondary | ICD-10-CM | POA: Diagnosis not present

## 2022-05-03 DIAGNOSIS — M47816 Spondylosis without myelopathy or radiculopathy, lumbar region: Secondary | ICD-10-CM

## 2022-05-03 DIAGNOSIS — M545 Low back pain, unspecified: Secondary | ICD-10-CM | POA: Diagnosis not present

## 2022-05-03 MED ORDER — METHYLPREDNISOLONE ACETATE 80 MG/ML IJ SUSP
80.0000 mg | Freq: Once | INTRAMUSCULAR | Status: AC
  Administered 2022-05-03: 80 mg

## 2022-05-03 NOTE — Patient Instructions (Signed)

## 2022-05-03 NOTE — Progress Notes (Signed)
Pt state lower back pain that travels down her left hip. Pt state standing makes the pain worse. Pt state she rest and takes over the counter pain meds to help ease her pain.  Numeric Pain Rating Scale and Functional Assessment Average Pain 6   In the last MONTH (on 0-10 scale) has pain interfered with the following?  1. General activity like being  able to carry out your everyday physical activities such as walking, climbing stairs, carrying groceries, or moving a chair?  Rating(8)   +Driver, -BT, -Dye Allergies.

## 2022-05-10 DIAGNOSIS — R1084 Generalized abdominal pain: Secondary | ICD-10-CM | POA: Diagnosis not present

## 2022-05-10 DIAGNOSIS — E782 Mixed hyperlipidemia: Secondary | ICD-10-CM | POA: Diagnosis not present

## 2022-05-10 DIAGNOSIS — E1165 Type 2 diabetes mellitus with hyperglycemia: Secondary | ICD-10-CM | POA: Diagnosis not present

## 2022-05-10 DIAGNOSIS — E039 Hypothyroidism, unspecified: Secondary | ICD-10-CM | POA: Diagnosis not present

## 2022-05-10 DIAGNOSIS — I10 Essential (primary) hypertension: Secondary | ICD-10-CM | POA: Diagnosis not present

## 2022-05-11 ENCOUNTER — Telehealth: Payer: Self-pay | Admitting: Physical Medicine and Rehabilitation

## 2022-05-11 NOTE — Telephone Encounter (Signed)
Patient's daughter [name] called needing to reschedule an appointment for her mother. The number to contact [name] is [phone]

## 2022-05-15 ENCOUNTER — Encounter: Payer: Medicare Other | Admitting: Physical Medicine and Rehabilitation

## 2022-05-15 DIAGNOSIS — E1165 Type 2 diabetes mellitus with hyperglycemia: Secondary | ICD-10-CM | POA: Diagnosis not present

## 2022-05-15 DIAGNOSIS — E782 Mixed hyperlipidemia: Secondary | ICD-10-CM | POA: Diagnosis not present

## 2022-05-15 DIAGNOSIS — E039 Hypothyroidism, unspecified: Secondary | ICD-10-CM | POA: Diagnosis not present

## 2022-05-15 DIAGNOSIS — I10 Essential (primary) hypertension: Secondary | ICD-10-CM | POA: Diagnosis not present

## 2022-05-17 ENCOUNTER — Encounter: Payer: Medicare Other | Admitting: Physical Medicine and Rehabilitation

## 2022-05-30 NOTE — Procedures (Signed)
Lumbar Facet Joint Nerve Denervation  [name]      Date of Birth: Mar 28, 1946 MRN: [id number] PCP: [name], MD      Visit Date: 05/03/2022   Universal Protocol:    Date/Time: 07/25/239:16 PM  Consent Given By: the patient  Position: PRONE  Additional Comments: Vital signs were monitored before and after the procedure. Patient was prepped and draped in the usual sterile fashion. The correct patient, procedure, and site was verified.   Injection Procedure Details:   Procedure diagnoses:  1. Spondylosis without myelopathy or radiculopathy, lumbar region   2. Chronic bilateral low back pain without sciatica      Meds Administered:  Meds ordered this encounter  Medications   methylPREDNISolone acetate (DEPO-MEDROL) injection 80 mg     Laterality: Left  Location/Site:  L4-L5, L3 and L4 medial branches and L5-S1, L4 medial branch and L5 dorsal ramus  Needle: 18 ga.,  69m active tip, 1056mRF Cannula  Needle Placement: Along juncture of superior articular process and transverse pocess  Findings:  -Comments:  Procedure Details: For each desired target nerve, the corresponding transverse process (sacral ala for the L5 dorsal rami) was identified and the fluoroscope was positioned to square off the endplates of the corresponding vertebral body to achieve a true AP midline view.  The beam was then obliqued 15 to 20 degrees and caudally tilted 15 to 20 degrees to line up a trajectory along the target nerves. The skin over the target of the junction of superior articulating process and transverse process (sacral ala for the L5 dorsal rami) was infiltrated with 9m67mf 1% Lidocaine without Epinephrine.  The 18 gauge 76m77mtive tip outer cannula was advanced in trajectory view to the target.  This procedure was repeated for each target nerve.  Then, for all levels, the outer cannula placement was fine-tuned and the position was then confirmed with bi-planar  imaging.    Test stimulation was done both at sensory and motor levels to ensure there was no radicular stimulation. The target tissues were then infiltrated with 1 ml of 1% Lidocaine without Epinephrine. Subsequently, a percutaneous neurotomy was carried out for 90 seconds at 80 degrees Celsius.  After the completion of the lesion, 1 ml of injectate was delivered. It was then repeated for each facet joint nerve mentioned above. Appropriate radiographs were obtained to verify the probe placement during the neurotomy.   Additional Comments:  The patient tolerated the procedure well Dressing: 2 x 2 sterile gauze and Band-Aid    Post-procedure details: Patient was observed during the procedure. Post-procedure instructions were reviewed.  Patient left the clinic in stable condition.

## 2022-05-30 NOTE — Progress Notes (Signed)
Vanessa Morrow - 76 y.o. female MRN [id number]  Date of birth: 12/20/1945  Office Visit Note: Visit Date: 05/03/2022 PCP: [name], MD Referred by: [name], MD  Subjective: Chief Complaint  Patient presents with   Lower Back - Pain   HPI:  Vanessa Morrow is a 76 y.o. female who comes in todayfor planned radiofrequency ablation of the Left L4-5 and L5-S1 Lumbar facet joints. This would be ablation of the corresponding medial branches and/or dorsal rami.  Patient has had double diagnostic blocks with more than 50% relief.  These are documented on pain diary.  They have had chronic back pain for quite some time, more than 3 months, which has been an ongoing situation with recalcitrant axial back pain.  They have no radicular pain.  Their axial pain is worse with standing and ambulating and on exam today with facet loading.  They have had physical therapy as well as home exercise program.  The imaging noted in the chart below indicated facet pathology. Accordingly they meet all the criteria and qualification for for radiofrequency ablation and we are going to complete this today hopefully for more longer term relief as part of comprehensive management program.   ROS Otherwise per HPI.  Assessment & Plan: Visit Diagnoses:    ICD-10-CM   1. Spondylosis without myelopathy or radiculopathy, lumbar region  M47.816 XR C-ARM NO REPORT    Radiofrequency,Lumbar    methylPREDNISolone acetate (DEPO-MEDROL) injection 80 mg    2. Chronic bilateral low back pain without sciatica  M54.50 XR C-ARM NO REPORT   G89.29 Radiofrequency,Lumbar    methylPREDNISolone acetate (DEPO-MEDROL) injection 80 mg      Plan: No additional findings.   Meds & Orders:  Meds ordered this encounter  Medications   methylPREDNISolone acetate (DEPO-MEDROL) injection 80 mg    Orders Placed This Encounter  Procedures   Radiofrequency,Lumbar   XR C-ARM NO REPORT    Follow-up: Return for visit to  requesting provider as needed.   Procedures: No procedures performed  Lumbar Facet Joint Nerve Denervation  [name]      Date of Birth: [birth year] MRN: [id number] PCP: [name], MD      Visit Date: 05/03/2022   Universal Protocol:    Date/Time: 07/25/239:16 PM  Consent Given By: the patient  Position: PRONE  Additional Comments: Vital signs were monitored before and after the procedure. Patient was prepped and draped in the usual sterile fashion. The correct patient, procedure, and site was verified.   Injection Procedure Details:   Procedure diagnoses:  1. Spondylosis without myelopathy or radiculopathy, lumbar region   2. Chronic bilateral low back pain without sciatica      Meds Administered:  Meds ordered this encounter  Medications   methylPREDNISolone acetate (DEPO-MEDROL) injection 80 mg     Laterality: Left  Location/Site:  L4-L5, L3 and L4 medial branches and L5-S1, L4 medial branch and L5 dorsal ramus  Needle: 18 ga.,  23m active tip, 1030mRF Cannula  Needle Placement: Along juncture of superior articular process and transverse pocess  Findings:  -Comments:  Procedure Details: For each desired target nerve, the corresponding transverse process (sacral ala for the L5 dorsal rami) was identified and the fluoroscope was positioned to square off the endplates of the corresponding vertebral body to achieve a true AP midline view.  The beam was then obliqued 15 to 20 degrees and caudally tilted 15 to 20 degrees to line up a trajectory along the  target nerves. The skin over the target of the junction of superior articulating process and transverse process (sacral ala for the L5 dorsal rami) was infiltrated with 37m of 1% Lidocaine without Epinephrine.  The 18 gauge 168mactive tip outer cannula was advanced in trajectory view to the target.  This procedure was repeated for each target nerve.  Then, for all levels, the outer cannula  placement was fine-tuned and the position was then confirmed with bi-planar imaging.    Test stimulation was done both at sensory and motor levels to ensure there was no radicular stimulation. The target tissues were then infiltrated with 1 ml of 1% Lidocaine without Epinephrine. Subsequently, a percutaneous neurotomy was carried out for 90 seconds at 80 degrees Celsius.  After the completion of the lesion, 1 ml of injectate was delivered. It was then repeated for each facet joint nerve mentioned above. Appropriate radiographs were obtained to verify the probe placement during the neurotomy.   Additional Comments:  The patient tolerated the procedure well Dressing: 2 x 2 sterile gauze and Band-Aid    Post-procedure details: Patient was observed during the procedure. Post-procedure instructions were reviewed.  Patient left the clinic in stable condition.      Clinical History: No specialty comments available.     Objective:  VS:  HT:    WT:   BMI:     BP:(!) 162/94  HR:76bpm  TEMP: ( )  RESP:  Physical Exam Vitals and nursing note reviewed.  Constitutional:      General: She is not in acute distress.    Appearance: Normal appearance. She is not ill-appearing.  HENT:     Head: Normocephalic and atraumatic.     Right Ear: External ear normal.     Left Ear: External ear normal.  Eyes:     Extraocular Movements: Extraocular movements intact.  Cardiovascular:     Rate and Rhythm: Normal rate.     Pulses: Normal pulses.  Pulmonary:     Effort: Pulmonary effort is normal. No respiratory distress.  Abdominal:     General: There is no distension.     Palpations: Abdomen is soft.  Musculoskeletal:        General: Tenderness present.     Cervical back: Neck supple.     Right lower leg: No edema.     Left lower leg: No edema.     Comments: Patient has good distal strength with no pain over the greater trochanters.  No clonus or focal weakness. Patient somewhat slow to rise  from a seated position to full extension.  There is concordant low back pain with facet loading and lumbar spine extension rotation.  There are no definitive trigger points but the patient is somewhat tender across the lower back and PSIS.  There is no pain with hip rotation.   Skin:    Findings: No erythema, lesion or rash.  Neurological:     General: No focal deficit present.     Mental Status: She is alert and oriented to person, place, and time.     Sensory: No sensory deficit.     Motor: No weakness or abnormal muscle tone.     Coordination: Coordination normal.  Psychiatric:        Mood and Affect: Mood normal.        Behavior: Behavior normal.      Imaging: No results found.

## 2022-06-07 ENCOUNTER — Ambulatory Visit: Payer: Self-pay

## 2022-06-07 ENCOUNTER — Ambulatory Visit (INDEPENDENT_AMBULATORY_CARE_PROVIDER_SITE_OTHER): Payer: Medicare Other | Admitting: Physical Medicine and Rehabilitation

## 2022-06-07 VITALS — BP 119/82 | HR 70

## 2022-06-07 DIAGNOSIS — M47816 Spondylosis without myelopathy or radiculopathy, lumbar region: Secondary | ICD-10-CM

## 2022-06-07 MED ORDER — METHYLPREDNISOLONE ACETATE 80 MG/ML IJ SUSP
80.0000 mg | Freq: Once | INTRAMUSCULAR | Status: AC
  Administered 2022-06-07: 80 mg

## 2022-06-07 NOTE — Progress Notes (Signed)
Numeric Pain Rating Scale and Functional Assessment Average Pain 0  Right sided pain. Left hip/leg also hurts. Hard to lift left leg. In the last MONTH (on 0-10 scale) has pain interfered with the following?  1. General activity like being  able to carry out your everyday physical activities such as walking, climbing stairs, carrying groceries, or moving a chair?  Rating(0)   +Driver, -BT, -Dye Allergies.

## 2022-06-07 NOTE — Patient Instructions (Signed)

## 2022-06-18 NOTE — Progress Notes (Signed)
Vanessa Morrow - 76 y.o. female MRN [id number]  Date of birth: 1946/04/04  Office Visit Note: Visit Date: 06/07/2022 PCP: [name], MD Referred by: [name], MD  Subjective: Chief Complaint  Patient presents with   Lower Back - Pain   HPI:  Vanessa Morrow is a 76 y.o. female who comes in todayfor planned radiofrequency ablation of the Right L4-5 and L5-S1 Lumbar facet joints. This would be ablation of the corresponding medial branches and/or dorsal rami.  Patient has had double diagnostic blocks with more than 50% relief.  These are documented on pain diary.  They have had chronic back pain for quite some time, more than 3 months, which has been an ongoing situation with recalcitrant axial back pain.  They have no radicular pain.  Their axial pain is worse with standing and ambulating and on exam today with facet loading.  They have had physical therapy as well as home exercise program.  The imaging noted in the chart below indicated facet pathology. Accordingly they meet all the criteria and qualification for for radiofrequency ablation and we are going to complete this today hopefully for more longer term relief as part of comprehensive management program.   ROS Otherwise per HPI.  Assessment & Plan: Visit Diagnoses:    ICD-10-CM   1. Spondylosis without myelopathy or radiculopathy, lumbar region  M47.816 XR C-ARM NO REPORT    Radiofrequency,Lumbar    methylPREDNISolone acetate (DEPO-MEDROL) injection 80 mg      Plan: No additional findings.   Meds & Orders:  Meds ordered this encounter  Medications   methylPREDNISolone acetate (DEPO-MEDROL) injection 80 mg    Orders Placed This Encounter  Procedures   Radiofrequency,Lumbar   XR C-ARM NO REPORT    Follow-up: Return for visit to requesting provider as needed.   Procedures: No procedures performed  Lumbar Facet Joint Nerve Denervation  [name]      Date of Birth: 07/12/1946 MRN:  [id number] PCP: [name], MD      Visit Date: 06/07/2022   Universal Protocol:    Date/Time: 08/13/239:05 PM  Consent Given By: the patient  Position: PRONE  Additional Comments: Vital signs were monitored before and after the procedure. Patient was prepped and draped in the usual sterile fashion. The correct patient, procedure, and site was verified.   Injection Procedure Details:   Procedure diagnoses:  1. Spondylosis without myelopathy or radiculopathy, lumbar region      Meds Administered:  Meds ordered this encounter  Medications   methylPREDNISolone acetate (DEPO-MEDROL) injection 80 mg     Laterality: Right  Location/Site:  L4-L5, L3 and L4 medial branches and L5-S1, L4 medial branch and L5 dorsal ramus  Needle: 18 ga.,  23m active tip, 1068mRF Cannula  Needle Placement: Along juncture of superior articular process and transverse pocess  Findings:  -Comments:  Procedure Details: For each desired target nerve, the corresponding transverse process (sacral ala for the L5 dorsal rami) was identified and the fluoroscope was positioned to square off the endplates of the corresponding vertebral body to achieve a true AP midline view.  The beam was then obliqued 15 to 20 degrees and caudally tilted 15 to 20 degrees to line up a trajectory along the target nerves. The skin over the target of the junction of superior articulating process and transverse process (sacral ala for the L5 dorsal rami) was infiltrated with 76m28mf 1% Lidocaine without Epinephrine.  The 18 gauge 27m66mtive tip  outer cannula was advanced in trajectory view to the target.  This procedure was repeated for each target nerve.  Then, for all levels, the outer cannula placement was fine-tuned and the position was then confirmed with bi-planar imaging.    Test stimulation was done both at sensory and motor levels to ensure there was no radicular stimulation. The target tissues were then  infiltrated with 1 ml of 1% Lidocaine without Epinephrine. Subsequently, a percutaneous neurotomy was carried out for 90 seconds at 80 degrees Celsius.  After the completion of the lesion, 1 ml of injectate was delivered. It was then repeated for each facet joint nerve mentioned above. Appropriate radiographs were obtained to verify the probe placement during the neurotomy.   Additional Comments:  The patient tolerated the procedure well Dressing: 2 x 2 sterile gauze and Band-Aid    Post-procedure details: Patient was observed during the procedure. Post-procedure instructions were reviewed.  Patient left the clinic in stable condition.      Clinical History: No specialty comments available.     Objective:  VS:  HT:    WT:   BMI:     BP:119/82  HR:70bpm  TEMP: ( )  RESP:  Physical Exam Vitals and nursing note reviewed.  Constitutional:      General: She is not in acute distress.    Appearance: Normal appearance. She is not ill-appearing.  HENT:     Head: Normocephalic and atraumatic.     Right Ear: External ear normal.     Left Ear: External ear normal.  Eyes:     Extraocular Movements: Extraocular movements intact.  Cardiovascular:     Rate and Rhythm: Normal rate.     Pulses: Normal pulses.  Pulmonary:     Effort: Pulmonary effort is normal. No respiratory distress.  Abdominal:     General: There is no distension.     Palpations: Abdomen is soft.  Musculoskeletal:        General: Tenderness present.     Cervical back: Neck supple.     Right lower leg: No edema.     Left lower leg: No edema.     Comments: Patient has good distal strength with no pain over the greater trochanters.  No clonus or focal weakness.  Skin:    Findings: No erythema, lesion or rash.  Neurological:     General: No focal deficit present.     Mental Status: She is alert and oriented to person, place, and time.     Sensory: No sensory deficit.     Motor: No weakness or abnormal muscle  tone.     Coordination: Coordination normal.  Psychiatric:        Mood and Affect: Mood normal.        Behavior: Behavior normal.      Imaging: No results found.

## 2022-06-18 NOTE — Procedures (Signed)
Lumbar Facet Joint Nerve Denervation  [name]      Date of Birth: April 14, 1946 MRN: [id number] PCP: [name], MD      Visit Date: 06/07/2022   Universal Protocol:    Date/Time: 08/13/239:05 PM  Consent Given By: the patient  Position: PRONE  Additional Comments: Vital signs were monitored before and after the procedure. Patient was prepped and draped in the usual sterile fashion. The correct patient, procedure, and site was verified.   Injection Procedure Details:   Procedure diagnoses:  1. Spondylosis without myelopathy or radiculopathy, lumbar region      Meds Administered:  Meds ordered this encounter  Medications   methylPREDNISolone acetate (DEPO-MEDROL) injection 80 mg     Laterality: Right  Location/Site:  L4-L5, L3 and L4 medial branches and L5-S1, L4 medial branch and L5 dorsal ramus  Needle: 18 ga.,  40m active tip, 1023mRF Cannula  Needle Placement: Along juncture of superior articular process and transverse pocess  Findings:  -Comments:  Procedure Details: For each desired target nerve, the corresponding transverse process (sacral ala for the L5 dorsal rami) was identified and the fluoroscope was positioned to square off the endplates of the corresponding vertebral body to achieve a true AP midline view.  The beam was then obliqued 15 to 20 degrees and caudally tilted 15 to 20 degrees to line up a trajectory along the target nerves. The skin over the target of the junction of superior articulating process and transverse process (sacral ala for the L5 dorsal rami) was infiltrated with 105m42mf 1% Lidocaine without Epinephrine.  The 18 gauge 33m32mtive tip outer cannula was advanced in trajectory view to the target.  This procedure was repeated for each target nerve.  Then, for all levels, the outer cannula placement was fine-tuned and the position was then confirmed with bi-planar imaging.    Test stimulation was done both at sensory  and motor levels to ensure there was no radicular stimulation. The target tissues were then infiltrated with 1 ml of 1% Lidocaine without Epinephrine. Subsequently, a percutaneous neurotomy was carried out for 90 seconds at 80 degrees Celsius.  After the completion of the lesion, 1 ml of injectate was delivered. It was then repeated for each facet joint nerve mentioned above. Appropriate radiographs were obtained to verify the probe placement during the neurotomy.   Additional Comments:  The patient tolerated the procedure well Dressing: 2 x 2 sterile gauze and Band-Aid    Post-procedure details: Patient was observed during the procedure. Post-procedure instructions were reviewed.  Patient left the clinic in stable condition.

## 2022-06-20 ENCOUNTER — Encounter: Payer: Self-pay | Admitting: Orthopaedic Surgery

## 2022-06-20 ENCOUNTER — Ambulatory Visit (INDEPENDENT_AMBULATORY_CARE_PROVIDER_SITE_OTHER): Payer: Medicare Other

## 2022-06-20 ENCOUNTER — Ambulatory Visit (INDEPENDENT_AMBULATORY_CARE_PROVIDER_SITE_OTHER): Payer: Medicare Other | Admitting: Orthopaedic Surgery

## 2022-06-20 DIAGNOSIS — M25552 Pain in left hip: Secondary | ICD-10-CM
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
## 2022-06-21 DIAGNOSIS — Z0001 Encounter for general adult medical examination with abnormal findings: Secondary | ICD-10-CM | POA: Diagnosis not present

## 2022-06-21 DIAGNOSIS — E1165 Type 2 diabetes mellitus with hyperglycemia: Secondary | ICD-10-CM | POA: Diagnosis not present

## 2022-06-21 DIAGNOSIS — E782 Mixed hyperlipidemia: Secondary | ICD-10-CM | POA: Diagnosis not present

## 2022-06-21 DIAGNOSIS — E039 Hypothyroidism, unspecified: Secondary | ICD-10-CM | POA: Diagnosis not present

## 2022-06-21 DIAGNOSIS — I10 Essential (primary) hypertension: Secondary | ICD-10-CM | POA: Diagnosis not present

## 2022-06-26 ENCOUNTER — Ambulatory Visit: Payer: Medicare Other | Admitting: Sports Medicine

## 2022-07-03 ENCOUNTER — Ambulatory Visit: Payer: Self-pay

## 2022-07-03 ENCOUNTER — Encounter: Payer: Self-pay | Admitting: Sports Medicine

## 2022-07-03 ENCOUNTER — Ambulatory Visit (INDEPENDENT_AMBULATORY_CARE_PROVIDER_SITE_OTHER): Payer: Medicare Other | Admitting: Sports Medicine

## 2022-07-03 VITALS — BP 116/82 | HR 66 | Ht 66.0 in | Wt 197.0 lb

## 2022-07-03 DIAGNOSIS — M25552 Pain in left hip: Secondary | ICD-10-CM | POA: Diagnosis not present

## 2022-07-03 NOTE — Progress Notes (Signed)
   Procedure Note  [name]             Date of Birth: [birth year]           MRN: [id number]             Visit Date: 07/03/2022  Procedures: Visit Diagnoses:  1. Pain in left hip    Procedure: US-guided intra-articular hip injection, left After discussion on risks/benefits/indications and informed verbal consent was obtained, a timeout was performed. Patient was lying supine on exam table. The hip was cleaned with betadine and alcohol swab. Then utilizing ultrasound guidance, the patient's femoral head and neck junction was identified and subsequently injected with 4:1 lidocaine:methylprednisolone via an in-plane approach with ultrasound visualization of the injectate administered into the hip joint. Patient tolerated procedure well without immediate complications.  -Reevaluated patient about 10 minutes postinjection and she had improvement in range of motion and pain -Follow-up with Dr. Erlinda Hong as indicated; happy to see her PRN  Elba Barman, DO [place]  This note was dictated using Dragon naturally speaking software and may contain errors in syntax, spelling, or content which have not been identified prior to signing this note.

## 2022-07-13 DIAGNOSIS — Z23 Encounter for immunization: Secondary | ICD-10-CM | POA: Diagnosis not present

## 2022-07-13 DIAGNOSIS — I10 Essential (primary) hypertension: Secondary | ICD-10-CM | POA: Diagnosis not present

## 2022-07-13 DIAGNOSIS — E1165 Type 2 diabetes mellitus with hyperglycemia: Secondary | ICD-10-CM | POA: Diagnosis not present

## 2022-07-13 DIAGNOSIS — N1831 Chronic kidney disease, stage 3a: Secondary | ICD-10-CM | POA: Diagnosis not present

## 2022-07-13 DIAGNOSIS — E782 Mixed hyperlipidemia: Secondary | ICD-10-CM | POA: Diagnosis not present

## 2022-07-13 DIAGNOSIS — E039 Hypothyroidism, unspecified: Secondary | ICD-10-CM | POA: Diagnosis not present

## 2022-08-03 DIAGNOSIS — N183 Chronic kidney disease, stage 3 unspecified: Secondary | ICD-10-CM | POA: Diagnosis not present

## 2022-08-17 ENCOUNTER — Other Ambulatory Visit: Payer: Self-pay | Admitting: Pulmonary Disease

## 2022-08-21 ENCOUNTER — Other Ambulatory Visit: Payer: Self-pay | Admitting: Physician Assistant

## 2022-09-08 DIAGNOSIS — E782 Mixed hyperlipidemia: Secondary | ICD-10-CM | POA: Diagnosis not present

## 2022-09-08 DIAGNOSIS — I10 Essential (primary) hypertension: Secondary | ICD-10-CM | POA: Diagnosis not present

## 2022-09-08 DIAGNOSIS — Z0001 Encounter for general adult medical examination with abnormal findings: Secondary | ICD-10-CM | POA: Diagnosis not present

## 2022-09-08 DIAGNOSIS — N1831 Chronic kidney disease, stage 3a: Secondary | ICD-10-CM | POA: Diagnosis not present

## 2022-09-08 DIAGNOSIS — E1165 Type 2 diabetes mellitus with hyperglycemia: Secondary | ICD-10-CM | POA: Diagnosis not present

## 2022-09-08 DIAGNOSIS — E039 Hypothyroidism, unspecified: Secondary | ICD-10-CM | POA: Diagnosis not present

## 2022-09-15 DIAGNOSIS — N1831 Chronic kidney disease, stage 3a: Secondary | ICD-10-CM | POA: Diagnosis not present

## 2022-09-15 DIAGNOSIS — E782 Mixed hyperlipidemia: Secondary | ICD-10-CM | POA: Diagnosis not present

## 2022-09-15 DIAGNOSIS — E1165 Type 2 diabetes mellitus with hyperglycemia: Secondary | ICD-10-CM | POA: Diagnosis not present

## 2022-09-15 DIAGNOSIS — E039 Hypothyroidism, unspecified: Secondary | ICD-10-CM | POA: Diagnosis not present

## 2022-09-15 DIAGNOSIS — I10 Essential (primary) hypertension: Secondary | ICD-10-CM | POA: Diagnosis not present

## 2022-09-20 ENCOUNTER — Telehealth: Payer: Self-pay | Admitting: Orthopaedic Surgery

## 2022-09-20 NOTE — Telephone Encounter (Signed)
Pt called asking about her muscle relaxer it needs to be filled.

## 2022-09-21 ENCOUNTER — Other Ambulatory Visit: Payer: Self-pay | Admitting: Physician Assistant

## 2022-09-21 DIAGNOSIS — E782 Mixed hyperlipidemia: Secondary | ICD-10-CM | POA: Diagnosis not present

## 2022-09-21 DIAGNOSIS — N1831 Chronic kidney disease, stage 3a: Secondary | ICD-10-CM | POA: Diagnosis not present

## 2022-09-21 DIAGNOSIS — E1165 Type 2 diabetes mellitus with hyperglycemia: Secondary | ICD-10-CM | POA: Diagnosis not present

## 2022-09-21 DIAGNOSIS — I10 Essential (primary) hypertension: Secondary | ICD-10-CM | POA: Diagnosis not present

## 2022-09-21 DIAGNOSIS — E039 Hypothyroidism, unspecified: Secondary | ICD-10-CM | POA: Diagnosis not present

## 2022-09-21 MED ORDER — METHOCARBAMOL 500 MG PO TABS: 500.0000 mg | ORAL_TABLET | Freq: Two times a day (BID) | ORAL | 1 refills | Status: DC | PRN

## 2022-09-21 NOTE — Telephone Encounter (Signed)
sent 

## 2022-10-24 DIAGNOSIS — E1165 Type 2 diabetes mellitus with hyperglycemia: Secondary | ICD-10-CM | POA: Diagnosis not present

## 2022-10-24 DIAGNOSIS — E782 Mixed hyperlipidemia: Secondary | ICD-10-CM | POA: Diagnosis not present

## 2022-10-24 DIAGNOSIS — N1831 Chronic kidney disease, stage 3a: Secondary | ICD-10-CM | POA: Diagnosis not present

## 2022-10-24 DIAGNOSIS — I10 Essential (primary) hypertension: Secondary | ICD-10-CM | POA: Diagnosis not present

## 2022-10-24 DIAGNOSIS — E876 Hypokalemia: Secondary | ICD-10-CM | POA: Diagnosis not present

## 2022-10-24 DIAGNOSIS — E039 Hypothyroidism, unspecified: Secondary | ICD-10-CM | POA: Diagnosis not present

## 2022-11-20 DIAGNOSIS — N1831 Chronic kidney disease, stage 3a: Secondary | ICD-10-CM | POA: Diagnosis not present

## 2022-11-20 DIAGNOSIS — E1165 Type 2 diabetes mellitus with hyperglycemia: Secondary | ICD-10-CM | POA: Diagnosis not present

## 2022-11-20 DIAGNOSIS — E039 Hypothyroidism, unspecified: Secondary | ICD-10-CM | POA: Diagnosis not present

## 2022-11-20 DIAGNOSIS — I10 Essential (primary) hypertension: Secondary | ICD-10-CM | POA: Diagnosis not present

## 2022-11-20 DIAGNOSIS — E782 Mixed hyperlipidemia: Secondary | ICD-10-CM | POA: Diagnosis not present

## 2022-12-05 DIAGNOSIS — Z794 Long term (current) use of insulin: Secondary | ICD-10-CM | POA: Diagnosis not present

## 2022-12-05 DIAGNOSIS — E1143 Type 2 diabetes mellitus with diabetic autonomic (poly)neuropathy: Secondary | ICD-10-CM | POA: Diagnosis not present

## 2022-12-05 DIAGNOSIS — E1122 Type 2 diabetes mellitus with diabetic chronic kidney disease: Secondary | ICD-10-CM | POA: Diagnosis not present

## 2022-12-05 DIAGNOSIS — E1165 Type 2 diabetes mellitus with hyperglycemia: Secondary | ICD-10-CM | POA: Diagnosis not present

## 2022-12-05 DIAGNOSIS — N184 Chronic kidney disease, stage 4 (severe): Secondary | ICD-10-CM | POA: Diagnosis not present

## 2022-12-05 DIAGNOSIS — K3184 Gastroparesis: Secondary | ICD-10-CM | POA: Diagnosis not present

## 2022-12-05 DIAGNOSIS — E1142 Type 2 diabetes mellitus with diabetic polyneuropathy: Secondary | ICD-10-CM | POA: Diagnosis not present

## 2022-12-05 DIAGNOSIS — E876 Hypokalemia: Secondary | ICD-10-CM | POA: Diagnosis not present

## 2022-12-19 DIAGNOSIS — E876 Hypokalemia: Secondary | ICD-10-CM | POA: Diagnosis not present

## 2022-12-19 DIAGNOSIS — I10 Essential (primary) hypertension: Secondary | ICD-10-CM | POA: Diagnosis not present

## 2022-12-22 ENCOUNTER — Ambulatory Visit (INDEPENDENT_AMBULATORY_CARE_PROVIDER_SITE_OTHER): Payer: 59

## 2022-12-22 ENCOUNTER — Encounter: Payer: Self-pay | Admitting: Orthopaedic Surgery

## 2022-12-22 ENCOUNTER — Ambulatory Visit (INDEPENDENT_AMBULATORY_CARE_PROVIDER_SITE_OTHER): Payer: 59 | Admitting: Orthopaedic Surgery

## 2022-12-22 DIAGNOSIS — M25562 Pain in left knee: Secondary | ICD-10-CM | POA: Diagnosis not present

## 2022-12-22 DIAGNOSIS — M25561 Pain in right knee: Secondary | ICD-10-CM

## 2022-12-22 DIAGNOSIS — G8929 Other chronic pain: Secondary | ICD-10-CM | POA: Diagnosis not present
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
## 2023-01-02 DIAGNOSIS — E039 Hypothyroidism, unspecified: Secondary | ICD-10-CM | POA: Diagnosis not present

## 2023-01-02 DIAGNOSIS — Z794 Long term (current) use of insulin: Secondary | ICD-10-CM | POA: Diagnosis not present

## 2023-01-02 DIAGNOSIS — K3184 Gastroparesis: Secondary | ICD-10-CM | POA: Diagnosis not present

## 2023-01-02 DIAGNOSIS — E1143 Type 2 diabetes mellitus with diabetic autonomic (poly)neuropathy: Secondary | ICD-10-CM | POA: Diagnosis not present

## 2023-01-02 DIAGNOSIS — R809 Proteinuria, unspecified: Secondary | ICD-10-CM | POA: Diagnosis not present

## 2023-01-02 DIAGNOSIS — N184 Chronic kidney disease, stage 4 (severe): Secondary | ICD-10-CM | POA: Diagnosis not present

## 2023-01-02 DIAGNOSIS — I10 Essential (primary) hypertension: Secondary | ICD-10-CM | POA: Diagnosis not present

## 2023-01-02 DIAGNOSIS — E1122 Type 2 diabetes mellitus with diabetic chronic kidney disease: Secondary | ICD-10-CM | POA: Diagnosis not present

## 2023-01-02 DIAGNOSIS — E1142 Type 2 diabetes mellitus with diabetic polyneuropathy: Secondary | ICD-10-CM | POA: Diagnosis not present

## 2023-01-02 DIAGNOSIS — E876 Hypokalemia: Secondary | ICD-10-CM | POA: Diagnosis not present

## 2023-01-02 DIAGNOSIS — E1129 Type 2 diabetes mellitus with other diabetic kidney complication: Secondary | ICD-10-CM | POA: Diagnosis not present

## 2023-01-10 DIAGNOSIS — I129 Hypertensive chronic kidney disease with stage 1 through stage 4 chronic kidney disease, or unspecified chronic kidney disease: Secondary | ICD-10-CM | POA: Diagnosis not present

## 2023-01-10 DIAGNOSIS — N183 Chronic kidney disease, stage 3 unspecified: Secondary | ICD-10-CM | POA: Diagnosis not present

## 2023-01-10 DIAGNOSIS — D649 Anemia, unspecified: Secondary | ICD-10-CM | POA: Diagnosis not present

## 2023-01-10 DIAGNOSIS — N2581 Secondary hyperparathyroidism of renal origin: Secondary | ICD-10-CM | POA: Diagnosis not present

## 2023-01-10 DIAGNOSIS — E1122 Type 2 diabetes mellitus with diabetic chronic kidney disease: Secondary | ICD-10-CM | POA: Diagnosis not present

## 2023-01-10 DIAGNOSIS — N390 Urinary tract infection, site not specified: Secondary | ICD-10-CM | POA: Diagnosis not present

## 2023-01-12 ENCOUNTER — Telehealth: Payer: Self-pay | Admitting: Orthopaedic Surgery

## 2023-01-12 NOTE — Telephone Encounter (Signed)
Great question.  I would defer that to her PCP who might know of an alternative.

## 2023-01-12 NOTE — Telephone Encounter (Signed)
Vanessa Morrow informed. She will contact pt's PCP

## 2023-01-12 NOTE — Telephone Encounter (Signed)
Patient's daughter [name] called advised due to Kidney function being at 28 patient need to discontinue taking Methocabamol. [name] asked if there medication patient can take for the muscle spasm and pain she is experiencing. The number to contact [name] is [phone]

## 2023-01-26 DIAGNOSIS — K3184 Gastroparesis: Secondary | ICD-10-CM | POA: Diagnosis not present

## 2023-01-26 DIAGNOSIS — Z794 Long term (current) use of insulin: Secondary | ICD-10-CM | POA: Diagnosis not present

## 2023-01-26 DIAGNOSIS — N184 Chronic kidney disease, stage 4 (severe): Secondary | ICD-10-CM | POA: Diagnosis not present

## 2023-01-26 DIAGNOSIS — E1129 Type 2 diabetes mellitus with other diabetic kidney complication: Secondary | ICD-10-CM | POA: Diagnosis not present

## 2023-01-26 DIAGNOSIS — I10 Essential (primary) hypertension: Secondary | ICD-10-CM | POA: Diagnosis not present

## 2023-01-26 DIAGNOSIS — E039 Hypothyroidism, unspecified: Secondary | ICD-10-CM | POA: Diagnosis not present

## 2023-01-26 DIAGNOSIS — E1143 Type 2 diabetes mellitus with diabetic autonomic (poly)neuropathy: Secondary | ICD-10-CM | POA: Diagnosis not present

## 2023-02-06 DIAGNOSIS — N183 Chronic kidney disease, stage 3 unspecified: Secondary | ICD-10-CM | POA: Diagnosis not present

## 2023-02-09 DIAGNOSIS — E039 Hypothyroidism, unspecified: Secondary | ICD-10-CM | POA: Diagnosis not present

## 2023-02-09 DIAGNOSIS — E1129 Type 2 diabetes mellitus with other diabetic kidney complication: Secondary | ICD-10-CM | POA: Diagnosis not present

## 2023-02-09 DIAGNOSIS — N184 Chronic kidney disease, stage 4 (severe): Secondary | ICD-10-CM | POA: Diagnosis not present

## 2023-02-09 DIAGNOSIS — I10 Essential (primary) hypertension: Secondary | ICD-10-CM | POA: Diagnosis not present

## 2023-02-09 DIAGNOSIS — Z794 Long term (current) use of insulin: Secondary | ICD-10-CM | POA: Diagnosis not present

## 2023-02-09 DIAGNOSIS — K3184 Gastroparesis: Secondary | ICD-10-CM | POA: Diagnosis not present

## 2023-02-09 DIAGNOSIS — E1143 Type 2 diabetes mellitus with diabetic autonomic (poly)neuropathy: Secondary | ICD-10-CM | POA: Diagnosis not present

## 2023-02-12 ENCOUNTER — Other Ambulatory Visit: Payer: Self-pay | Admitting: Internal Medicine

## 2023-02-12 DIAGNOSIS — Z1231 Encounter for screening mammogram for malignant neoplasm of breast: Secondary | ICD-10-CM

## 2023-02-14 DIAGNOSIS — N183 Chronic kidney disease, stage 3 unspecified: Secondary | ICD-10-CM | POA: Diagnosis not present

## 2023-02-14 DIAGNOSIS — N2581 Secondary hyperparathyroidism of renal origin: Secondary | ICD-10-CM | POA: Diagnosis not present

## 2023-02-14 DIAGNOSIS — D649 Anemia, unspecified: Secondary | ICD-10-CM | POA: Diagnosis not present

## 2023-02-14 DIAGNOSIS — I129 Hypertensive chronic kidney disease with stage 1 through stage 4 chronic kidney disease, or unspecified chronic kidney disease: Secondary | ICD-10-CM | POA: Diagnosis not present

## 2023-02-14 DIAGNOSIS — E1122 Type 2 diabetes mellitus with diabetic chronic kidney disease: Secondary | ICD-10-CM | POA: Diagnosis not present

## 2023-03-09 DIAGNOSIS — K3184 Gastroparesis: Secondary | ICD-10-CM | POA: Diagnosis not present

## 2023-03-09 DIAGNOSIS — Z794 Long term (current) use of insulin: Secondary | ICD-10-CM | POA: Diagnosis not present

## 2023-03-09 DIAGNOSIS — E1143 Type 2 diabetes mellitus with diabetic autonomic (poly)neuropathy: Secondary | ICD-10-CM | POA: Diagnosis not present

## 2023-03-09 DIAGNOSIS — I10 Essential (primary) hypertension: Secondary | ICD-10-CM | POA: Diagnosis not present

## 2023-03-09 DIAGNOSIS — N184 Chronic kidney disease, stage 4 (severe): Secondary | ICD-10-CM | POA: Diagnosis not present

## 2023-03-09 DIAGNOSIS — E039 Hypothyroidism, unspecified: Secondary | ICD-10-CM | POA: Diagnosis not present

## 2023-03-09 DIAGNOSIS — E1129 Type 2 diabetes mellitus with other diabetic kidney complication: Secondary | ICD-10-CM | POA: Diagnosis not present

## 2023-06-27 DIAGNOSIS — Z794 Long term (current) use of insulin: Secondary | ICD-10-CM | POA: Diagnosis not present

## 2023-06-27 DIAGNOSIS — E1143 Type 2 diabetes mellitus with diabetic autonomic (poly)neuropathy: Secondary | ICD-10-CM | POA: Diagnosis not present

## 2023-06-27 DIAGNOSIS — E039 Hypothyroidism, unspecified: Secondary | ICD-10-CM | POA: Diagnosis not present

## 2023-06-27 DIAGNOSIS — I10 Essential (primary) hypertension: Secondary | ICD-10-CM | POA: Diagnosis not present

## 2023-06-27 DIAGNOSIS — Z0001 Encounter for general adult medical examination with abnormal findings: Secondary | ICD-10-CM | POA: Diagnosis not present

## 2023-06-27 DIAGNOSIS — N184 Chronic kidney disease, stage 4 (severe): Secondary | ICD-10-CM | POA: Diagnosis not present

## 2023-06-27 DIAGNOSIS — E1129 Type 2 diabetes mellitus with other diabetic kidney complication: Secondary | ICD-10-CM | POA: Diagnosis not present

## 2023-06-27 DIAGNOSIS — K3184 Gastroparesis: Secondary | ICD-10-CM | POA: Diagnosis not present

## 2023-08-01 DIAGNOSIS — K3184 Gastroparesis: Secondary | ICD-10-CM | POA: Diagnosis not present

## 2023-08-01 DIAGNOSIS — Z794 Long term (current) use of insulin: Secondary | ICD-10-CM | POA: Diagnosis not present

## 2023-08-01 DIAGNOSIS — N184 Chronic kidney disease, stage 4 (severe): Secondary | ICD-10-CM | POA: Diagnosis not present

## 2023-08-01 DIAGNOSIS — I119 Hypertensive heart disease without heart failure: Secondary | ICD-10-CM | POA: Diagnosis not present

## 2023-08-01 DIAGNOSIS — E039 Hypothyroidism, unspecified: Secondary | ICD-10-CM | POA: Diagnosis not present

## 2023-08-01 DIAGNOSIS — E1129 Type 2 diabetes mellitus with other diabetic kidney complication: Secondary | ICD-10-CM | POA: Diagnosis not present

## 2023-08-01 DIAGNOSIS — E1143 Type 2 diabetes mellitus with diabetic autonomic (poly)neuropathy: Secondary | ICD-10-CM | POA: Diagnosis not present

## 2023-08-03 DIAGNOSIS — N183 Chronic kidney disease, stage 3 unspecified: Secondary | ICD-10-CM | POA: Diagnosis not present

## 2023-08-03 DIAGNOSIS — E1122 Type 2 diabetes mellitus with diabetic chronic kidney disease: Secondary | ICD-10-CM | POA: Diagnosis not present

## 2023-08-03 DIAGNOSIS — I129 Hypertensive chronic kidney disease with stage 1 through stage 4 chronic kidney disease, or unspecified chronic kidney disease: Secondary | ICD-10-CM | POA: Diagnosis not present

## 2023-08-31 ENCOUNTER — Ambulatory Visit (INDEPENDENT_AMBULATORY_CARE_PROVIDER_SITE_OTHER): Payer: 59 | Admitting: Sports Medicine

## 2023-08-31 ENCOUNTER — Encounter: Payer: Self-pay | Admitting: Sports Medicine

## 2023-08-31 ENCOUNTER — Other Ambulatory Visit: Payer: Self-pay

## 2023-08-31 DIAGNOSIS — G8929 Other chronic pain: Secondary | ICD-10-CM

## 2023-08-31 DIAGNOSIS — M1612 Unilateral primary osteoarthritis, left hip: Secondary | ICD-10-CM | POA: Diagnosis not present

## 2023-08-31 DIAGNOSIS — M47816 Spondylosis without myelopathy or radiculopathy, lumbar region: Secondary | ICD-10-CM

## 2023-08-31 DIAGNOSIS — M5442 Lumbago with sciatica, left side: Secondary | ICD-10-CM | POA: Diagnosis not present

## 2023-08-31 DIAGNOSIS — E1142 Type 2 diabetes mellitus with diabetic polyneuropathy: Secondary | ICD-10-CM

## 2023-08-31 DIAGNOSIS — Z794 Long term (current) use of insulin: Secondary | ICD-10-CM

## 2023-08-31 MED ORDER — METHYLPREDNISOLONE ACETATE 40 MG/ML IJ SUSP
40.0000 mg | INTRAMUSCULAR | Status: AC | PRN
  Administered 2023-08-31: 40 mg via INTRA_ARTICULAR

## 2023-08-31 MED ORDER — LIDOCAINE HCL 1 % IJ SOLN
4.0000 mL | INTRAMUSCULAR | Status: AC | PRN
  Administered 2023-08-31: 4 mL

## 2023-08-31 NOTE — Progress Notes (Signed)
Patient says that for about 2 months she has had pain in the left hip and down the left leg. Patient points to left low back and left SI region when describing pain, and confirms that she is having more of a tingling down the left leg that stops at the ankle. When describing hip pain she states that it begins in the back and feels that it wraps around to the front. Patient says that this is constant. She denies taking any pain medication.
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
## 2023-09-11 DIAGNOSIS — E1129 Type 2 diabetes mellitus with other diabetic kidney complication: Secondary | ICD-10-CM | POA: Diagnosis not present

## 2023-09-11 DIAGNOSIS — Z0001 Encounter for general adult medical examination with abnormal findings: Secondary | ICD-10-CM | POA: Diagnosis not present

## 2023-09-11 DIAGNOSIS — I119 Hypertensive heart disease without heart failure: Secondary | ICD-10-CM | POA: Diagnosis not present

## 2023-09-11 DIAGNOSIS — K3184 Gastroparesis: Secondary | ICD-10-CM | POA: Diagnosis not present

## 2023-09-11 DIAGNOSIS — E1143 Type 2 diabetes mellitus with diabetic autonomic (poly)neuropathy: Secondary | ICD-10-CM | POA: Diagnosis not present

## 2023-09-11 DIAGNOSIS — N184 Chronic kidney disease, stage 4 (severe): Secondary | ICD-10-CM | POA: Diagnosis not present

## 2023-09-11 DIAGNOSIS — Z794 Long term (current) use of insulin: Secondary | ICD-10-CM | POA: Diagnosis not present

## 2023-09-11 DIAGNOSIS — E039 Hypothyroidism, unspecified: Secondary | ICD-10-CM | POA: Diagnosis not present

## 2023-09-21 ENCOUNTER — Ambulatory Visit: Payer: 59 | Admitting: Sports Medicine

## 2023-10-09 ENCOUNTER — Ambulatory Visit (INDEPENDENT_AMBULATORY_CARE_PROVIDER_SITE_OTHER): Payer: 59 | Admitting: Sports Medicine

## 2023-10-09 ENCOUNTER — Encounter: Payer: Self-pay | Admitting: Sports Medicine

## 2023-10-09 DIAGNOSIS — M47816 Spondylosis without myelopathy or radiculopathy, lumbar region: Secondary | ICD-10-CM

## 2023-10-09 DIAGNOSIS — M1612 Unilateral primary osteoarthritis, left hip: Secondary | ICD-10-CM | POA: Diagnosis not present

## 2023-10-09 DIAGNOSIS — N1832 Chronic kidney disease, stage 3b: Secondary | ICD-10-CM

## 2023-10-09 DIAGNOSIS — R29898 Other symptoms and signs involving the musculoskeletal system: Secondary | ICD-10-CM

## 2023-10-09 NOTE — Progress Notes (Signed)
Patient says that she got relief from the injection, although she thinks she has overdone it with standing for 8-10 hours a day at work, and being on her feet more over the last week with Thanksgiving. She says that the pain in her leg is gone, but now her pain is mostly in the back of the left hip and lower back on the left side.  Patient was instructed in 10 minutes of therapeutic exercises for left hip to improve strength, ROM and function according to my instructions and plan of care by a Certified Athletic Trainer during the office visit. A customized handout was provided and demonstration of proper technique shown and discussed. Patient did perform exercises and demonstrate understanding through teachback.  All questions discussed and answered.
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
## 2023-10-10 DIAGNOSIS — E1122 Type 2 diabetes mellitus with diabetic chronic kidney disease: Secondary | ICD-10-CM | POA: Diagnosis not present

## 2023-10-18 DIAGNOSIS — I129 Hypertensive chronic kidney disease with stage 1 through stage 4 chronic kidney disease, or unspecified chronic kidney disease: Secondary | ICD-10-CM | POA: Diagnosis not present

## 2023-10-18 DIAGNOSIS — N1832 Chronic kidney disease, stage 3b: Secondary | ICD-10-CM | POA: Diagnosis not present

## 2023-10-18 DIAGNOSIS — E1122 Type 2 diabetes mellitus with diabetic chronic kidney disease: Secondary | ICD-10-CM | POA: Diagnosis not present

## 2023-10-29 DIAGNOSIS — I129 Hypertensive chronic kidney disease with stage 1 through stage 4 chronic kidney disease, or unspecified chronic kidney disease: Secondary | ICD-10-CM | POA: Diagnosis not present

## 2023-10-29 DIAGNOSIS — N1832 Chronic kidney disease, stage 3b: Secondary | ICD-10-CM | POA: Diagnosis not present

## 2023-11-02 ENCOUNTER — Telehealth: Payer: Self-pay | Admitting: Physical Medicine and Rehabilitation

## 2023-11-02 NOTE — Telephone Encounter (Signed)
Pt requesting appt for Back injection please advise call Daughter on Rae Halsted at [phone]

## 2023-11-06 ENCOUNTER — Encounter: Payer: Self-pay | Admitting: Physical Medicine and Rehabilitation

## 2023-11-06 ENCOUNTER — Ambulatory Visit (INDEPENDENT_AMBULATORY_CARE_PROVIDER_SITE_OTHER): Payer: 59 | Admitting: Physical Medicine and Rehabilitation

## 2023-11-06 VITALS — BP 144/80 | HR 89

## 2023-11-06 DIAGNOSIS — M545 Low back pain, unspecified: Secondary | ICD-10-CM | POA: Diagnosis not present

## 2023-11-06 DIAGNOSIS — M47819 Spondylosis without myelopathy or radiculopathy, site unspecified: Secondary | ICD-10-CM | POA: Diagnosis not present

## 2023-11-06 DIAGNOSIS — M47816 Spondylosis without myelopathy or radiculopathy, lumbar region: Secondary | ICD-10-CM | POA: Diagnosis not present

## 2023-11-06 DIAGNOSIS — G8929 Other chronic pain: Secondary | ICD-10-CM

## 2023-11-06 MED ORDER — DIAZEPAM 5 MG PO TABS: ORAL_TABLET | ORAL | 0 refills | Status: AC

## 2023-11-06 NOTE — Progress Notes (Signed)
 Functional Pain Scale - descriptive words and definitions  Distressing (6)    Pain is present/unable to complete most ADLs limited by pain/sleep is difficult and active distraction is only marginal. Moderate range order  Average Pain 6  Left LBP , had last back injection in August 2024. It helped some but just dulled the pain.
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
## 2023-11-14 ENCOUNTER — Telehealth: Payer: Self-pay | Admitting: Physical Medicine and Rehabilitation

## 2023-11-14 NOTE — Telephone Encounter (Signed)
 Patient called and waiting on a call back to be scheduled for back injection. CB#[phone]

## 2023-11-20 ENCOUNTER — Ambulatory Visit: Payer: 59 | Admitting: Sports Medicine

## 2023-11-27 ENCOUNTER — Ambulatory Visit (INDEPENDENT_AMBULATORY_CARE_PROVIDER_SITE_OTHER): Payer: 59 | Admitting: Physical Medicine and Rehabilitation

## 2023-11-27 ENCOUNTER — Other Ambulatory Visit: Payer: Self-pay

## 2023-11-27 DIAGNOSIS — M47819 Spondylosis without myelopathy or radiculopathy, site unspecified: Secondary | ICD-10-CM | POA: Diagnosis not present

## 2023-11-27 DIAGNOSIS — M47816 Spondylosis without myelopathy or radiculopathy, lumbar region: Secondary | ICD-10-CM

## 2023-11-27 MED ORDER — METHYLPREDNISOLONE ACETATE 40 MG/ML IJ SUSP
40.0000 mg | Freq: Once | INTRAMUSCULAR | Status: AC
  Administered 2023-11-27: 40 mg

## 2023-11-27 NOTE — Patient Instructions (Signed)

## 2023-12-06 NOTE — Progress Notes (Signed)
Vanessa Morrow - 78 y.o. female MRN [id number]  Date of birth: 1946-06-01  Office Visit Note: Visit Date: 11/27/2023 PCP: Jackie Plum, MD Referred by: Jackie Plum, MD  Subjective: Chief Complaint  Patient presents with   Lower Back - Pain   HPI:  Vanessa Morrow is a 78 y.o. female who comes in todayfor planned repeat radiofrequency ablation of the Left L4-5 and L5-S1  Lumbar facet joints. This would be ablation of the corresponding medial branches and/or dorsal rami.  Patient has had double diagnostic blocks with more than 70% relief.  Subsequent ablation gave them more than 6 months of over 60% relief.  They have had chronic back pain for quite some time, more than 3 months, which has been an ongoing situation with recalcitrant axial back pain.  They have no radicular pain.  Their axial pain is worse with standing and ambulating and on exam today with facet loading.  They have had physical therapy as well as home exercise program.  The imaging noted in the chart below indicated facet pathology. Accordingly they meet all the criteria and qualification for for radiofrequency ablation and we are going to complete this today hopefully for more longer term relief as part of comprehensive management program.   ROS Otherwise per HPI.  Assessment & Plan: Visit Diagnoses:    ICD-10-CM   1. Spondylosis without myelopathy or radiculopathy  M47.819 XR C-ARM NO REPORT    Radiofrequency,Lumbar    methylPREDNISolone acetate (DEPO-MEDROL) injection 40 mg      Plan: No additional findings.   Meds & Orders:  Meds ordered this encounter  Medications   methylPREDNISolone acetate (DEPO-MEDROL) injection 40 mg    Orders Placed This Encounter  Procedures   Radiofrequency,Lumbar   XR C-ARM NO REPORT    Follow-up: Return if symptoms worsen or fail to improve.   Procedures: No procedures performed  Lumbar Facet Joint Nerve Denervation  [name]      Date of Birth:  [birth year] MRN: [id number] PCP: Jackie Plum, MD      Visit Date: 11/27/2023   Universal Protocol:    Date/Time: 01/30/259:29 AM  Consent Given By: the patient  Position: PRONE  Additional Comments: Vital signs were monitored before and after the procedure. Patient was prepped and draped in the usual sterile fashion. The correct patient, procedure, and site was verified.   Injection Procedure Details:   Procedure diagnoses:  1. Spondylosis without myelopathy or radiculopathy      Meds Administered:  Meds ordered this encounter  Medications   methylPREDNISolone acetate (DEPO-MEDROL) injection 40 mg     Laterality: Left  Location/Site:  L4-L5, L3 and L4 medial branches and L5-S1, L4 medial branch and L5 dorsal ramus  Needle: 18 ga.,  10mm active tip, RF Cannula  Needle Placement: Along juncture of superior articular process and transverse pocess  Findings:  -Comments:  Procedure Details: For each desired target nerve, the corresponding transverse process (sacral ala for the L5 dorsal rami) was identified and the fluoroscope was positioned to square off the endplates of the corresponding vertebral body to achieve a true AP midline view.  The beam was then obliqued 15 to 20 degrees and caudally tilted 15 to 20 degrees to line up a trajectory along the target nerves. The skin over the target of the junction of superior articulating process and transverse process (sacral ala for the L5 dorsal rami) was infiltrated with 1ml of 1% Lidocaine without Epinephrine.  The 18  gauge 10mm active tip outer cannula was advanced in trajectory view to the target.  This procedure was repeated for each target nerve.  Then, for all levels, the outer cannula placement was fine-tuned and the position was then confirmed with bi-planar imaging.    Test stimulation was done both at sensory and motor levels to ensure there was no radicular stimulation. The target tissues were then  infiltrated with 1 ml of 1% Lidocaine without Epinephrine. Subsequently, a percutaneous neurotomy was carried out for 90 seconds at 80 degrees Celsius.  After the completion of the lesion, 1 ml of injectate was delivered. It was then repeated for each facet joint nerve mentioned above. Appropriate radiographs were obtained to verify the probe placement during the neurotomy.   Additional Comments:  No complications occurred Dressing: 2 x 2 sterile gauze and Band-Aid    Post-procedure details: Patient was observed during the procedure. Post-procedure instructions were reviewed.  Patient left the clinic in stable condition.      Clinical History: No specialty comments available.     Objective:  VS:  HT:    WT:   BMI:     BP:   HR: bpm  TEMP: ( )  RESP:  Physical Exam Vitals and nursing note reviewed.  Constitutional:      General: She is not in acute distress.    Appearance: Normal appearance. She is not ill-appearing.  HENT:     Head: Normocephalic and atraumatic.     Right Ear: External ear normal.     Left Ear: External ear normal.  Eyes:     Extraocular Movements: Extraocular movements intact.  Cardiovascular:     Rate and Rhythm: Normal rate.     Pulses: Normal pulses.  Pulmonary:     Effort: Pulmonary effort is normal. No respiratory distress.  Abdominal:     General: There is no distension.     Palpations: Abdomen is soft.  Musculoskeletal:        General: Tenderness present.     Cervical back: Neck supple.     Right lower leg: No edema.     Left lower leg: No edema.     Comments: Patient has good distal strength with no pain over the greater trochanters.  No clonus or focal weakness.  Skin:    Findings: No erythema, lesion or rash.  Neurological:     General: No focal deficit present.     Mental Status: She is alert and oriented to person, place, and time.     Sensory: No sensory deficit.     Motor: No weakness or abnormal muscle tone.      Coordination: Coordination normal.  Psychiatric:        Mood and Affect: Mood normal.        Behavior: Behavior normal.      Imaging: No results found.

## 2023-12-06 NOTE — Procedures (Signed)
Lumbar Facet Joint Nerve Denervation  [name]      Date of Birth: [birth year] MRN: [id number] PCP: Jackie Plum, MD      Visit Date: 11/27/2023   Universal Protocol:    Date/Time: 01/30/259:29 AM  Consent Given By: the patient  Position: PRONE  Additional Comments: Vital signs were monitored before and after the procedure. Patient was prepped and draped in the usual sterile fashion. The correct patient, procedure, and site was verified.   Injection Procedure Details:   Procedure diagnoses:  1. Spondylosis without myelopathy or radiculopathy      Meds Administered:  Meds ordered this encounter  Medications   methylPREDNISolone acetate (DEPO-MEDROL) injection 40 mg     Laterality: Left  Location/Site:  L4-L5, L3 and L4 medial branches and L5-S1, L4 medial branch and L5 dorsal ramus  Needle: 18 ga.,  10mm active tip, RF Cannula  Needle Placement: Along juncture of superior articular process and transverse pocess  Findings:  -Comments:  Procedure Details: For each desired target nerve, the corresponding transverse process (sacral ala for the L5 dorsal rami) was identified and the fluoroscope was positioned to square off the endplates of the corresponding vertebral body to achieve a true AP midline view.  The beam was then obliqued 15 to 20 degrees and caudally tilted 15 to 20 degrees to line up a trajectory along the target nerves. The skin over the target of the junction of superior articulating process and transverse process (sacral ala for the L5 dorsal rami) was infiltrated with 1ml of 1% Lidocaine without Epinephrine.  The 18 gauge 10mm active tip outer cannula was advanced in trajectory view to the target.  This procedure was repeated for each target nerve.  Then, for all levels, the outer cannula placement was fine-tuned and the position was then confirmed with bi-planar imaging.    Test stimulation was done both at sensory and motor levels  to ensure there was no radicular stimulation. The target tissues were then infiltrated with 1 ml of 1% Lidocaine without Epinephrine. Subsequently, a percutaneous neurotomy was carried out for 90 seconds at 80 degrees Celsius.  After the completion of the lesion, 1 ml of injectate was delivered. It was then repeated for each facet joint nerve mentioned above. Appropriate radiographs were obtained to verify the probe placement during the neurotomy.   Additional Comments:  No complications occurred Dressing: 2 x 2 sterile gauze and Band-Aid    Post-procedure details: Patient was observed during the procedure. Post-procedure instructions were reviewed.  Patient left the clinic in stable condition.

## 2023-12-11 ENCOUNTER — Ambulatory Visit (INDEPENDENT_AMBULATORY_CARE_PROVIDER_SITE_OTHER): Payer: 59 | Admitting: Physical Medicine and Rehabilitation

## 2023-12-11 ENCOUNTER — Other Ambulatory Visit: Payer: Self-pay

## 2023-12-11 DIAGNOSIS — M47819 Spondylosis without myelopathy or radiculopathy, site unspecified: Secondary | ICD-10-CM | POA: Diagnosis not present

## 2023-12-11 DIAGNOSIS — M47816 Spondylosis without myelopathy or radiculopathy, lumbar region: Secondary | ICD-10-CM | POA: Diagnosis not present

## 2023-12-11 MED ORDER — METHYLPREDNISOLONE ACETATE 40 MG/ML IJ SUSP: 40.0000 mg | Freq: Once | INTRAMUSCULAR | Status: DC

## 2023-12-11 NOTE — Procedures (Signed)
 Lumbar Facet Joint Nerve Denervation  [name]      Date of Birth: 1946-05-24 MRN: [id number] PCP: Catalina Bare, MD      Visit Date: 12/11/2023   Universal Protocol:    Date/Time: 02/04/254:32 PM  Consent Given By: the patient  Position: PRONE  Additional Comments: Vital signs were monitored before and after the procedure. Patient was prepped and draped in the usual sterile fashion. The correct patient, procedure, and site was verified.   Injection Procedure Details:   Procedure diagnoses:  1. Spondylosis without myelopathy or radiculopathy      Meds Administered:  Meds ordered this encounter  Medications   DISCONTD: methylPREDNISolone  acetate (DEPO-MEDROL ) injection 40 mg     Laterality: Right  Location/Site:  L4-L5, L3 and L4 medial branches and L5-S1, L4 medial branch and L5 dorsal ramus  Needle: 18 ga.,  10mm active tip, RF Cannula  Needle Placement: Along juncture of superior articular process and transverse pocess  Findings:  -Comments: The 3 radiofrequency cannulas were placed and appropriate position but no stimulation had been performed.  At that point as noted in the HPI today she wanted to discontinue the procedure as she felt like she wanted to work more on her left side.  Her left side is worse than right and we had already done the left side ablation a few weeks ago.  Will have her follow-up to evaluate the neck step.  At least 50% of the procedure was completed.  Procedure Details: For each desired target nerve, the corresponding transverse process (sacral ala for the L5 dorsal rami) was identified and the fluoroscope was positioned to square off the endplates of the corresponding vertebral body to achieve a true AP midline view.  The beam was then obliqued 15 to 20 degrees and caudally tilted 15 to 20 degrees to line up a trajectory along the target nerves. The skin over the target of the junction of superior articulating process  and transverse process (sacral ala for the L5 dorsal rami) was infiltrated with 1ml of 1% Lidocaine  without Epinephrine .  The 18 gauge 10mm active tip outer cannula was advanced in trajectory view to the target.  This procedure was repeated for each target nerve.  Then, for all levels, the outer cannula placement was fine-tuned and the position was then confirmed with bi-planar imaging.       Additional Comments:   Dressing: 2 x 2 sterile gauze and Band-Aid    Post-procedure details: Patient was observed during the procedure. Post-procedure instructions were reviewed.  Patient left the clinic in stable condition.
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
## 2023-12-11 NOTE — Progress Notes (Signed)
 Functional Pain Scale - descriptive words and definitions  Uncomfortable (3)  Pain is present but can complete all ADL's/sleep is slightly affected and passive distraction only gives marginal relief. Mild range order  Average Pain 4 L>R  Pain is worse when she is up moving around.  +Driver, -BT, -Dye Allergies.

## 2023-12-21 ENCOUNTER — Telehealth: Payer: Self-pay | Admitting: Physical Medicine and Rehabilitation

## 2023-12-21 ENCOUNTER — Ambulatory Visit: Payer: 59 | Admitting: Physical Medicine and Rehabilitation

## 2023-12-21 NOTE — Telephone Encounter (Signed)
Patient's daughter Velna Hatchet called advised patient is very sick and asked if a virtual appointment can be done?  Velna Hatchet said the everyone in their household is sick.  The number to contact Velna Hatchet is [phone]

## 2023-12-28 ENCOUNTER — Ambulatory Visit: Payer: 59 | Admitting: Physical Medicine and Rehabilitation

## 2024-01-08 DIAGNOSIS — N1832 Chronic kidney disease, stage 3b: Secondary | ICD-10-CM | POA: Diagnosis not present

## 2024-01-08 DIAGNOSIS — E1122 Type 2 diabetes mellitus with diabetic chronic kidney disease: Secondary | ICD-10-CM | POA: Diagnosis not present

## 2024-01-08 DIAGNOSIS — I129 Hypertensive chronic kidney disease with stage 1 through stage 4 chronic kidney disease, or unspecified chronic kidney disease: Secondary | ICD-10-CM | POA: Diagnosis not present

## 2024-01-10 DIAGNOSIS — E1143 Type 2 diabetes mellitus with diabetic autonomic (poly)neuropathy: Secondary | ICD-10-CM | POA: Diagnosis not present

## 2024-01-10 DIAGNOSIS — N184 Chronic kidney disease, stage 4 (severe): Secondary | ICD-10-CM | POA: Diagnosis not present

## 2024-01-10 DIAGNOSIS — I119 Hypertensive heart disease without heart failure: Secondary | ICD-10-CM | POA: Diagnosis not present

## 2024-01-10 DIAGNOSIS — K3184 Gastroparesis: Secondary | ICD-10-CM | POA: Diagnosis not present

## 2024-01-10 DIAGNOSIS — E1129 Type 2 diabetes mellitus with other diabetic kidney complication: Secondary | ICD-10-CM | POA: Diagnosis not present

## 2024-01-10 DIAGNOSIS — Z794 Long term (current) use of insulin: Secondary | ICD-10-CM | POA: Diagnosis not present

## 2024-01-10 DIAGNOSIS — E039 Hypothyroidism, unspecified: Secondary | ICD-10-CM | POA: Diagnosis not present

## 2024-01-16 DIAGNOSIS — Z794 Long term (current) use of insulin: Secondary | ICD-10-CM | POA: Diagnosis not present

## 2024-01-16 DIAGNOSIS — N184 Chronic kidney disease, stage 4 (severe): Secondary | ICD-10-CM | POA: Diagnosis not present

## 2024-01-16 DIAGNOSIS — E1129 Type 2 diabetes mellitus with other diabetic kidney complication: Secondary | ICD-10-CM | POA: Diagnosis not present

## 2024-01-16 DIAGNOSIS — E039 Hypothyroidism, unspecified: Secondary | ICD-10-CM | POA: Diagnosis not present

## 2024-01-16 DIAGNOSIS — E1143 Type 2 diabetes mellitus with diabetic autonomic (poly)neuropathy: Secondary | ICD-10-CM | POA: Diagnosis not present

## 2024-02-01 DIAGNOSIS — E1129 Type 2 diabetes mellitus with other diabetic kidney complication: Secondary | ICD-10-CM | POA: Diagnosis not present

## 2024-02-01 DIAGNOSIS — E1143 Type 2 diabetes mellitus with diabetic autonomic (poly)neuropathy: Secondary | ICD-10-CM | POA: Diagnosis not present

## 2024-02-01 DIAGNOSIS — Z794 Long term (current) use of insulin: Secondary | ICD-10-CM | POA: Diagnosis not present

## 2024-02-01 DIAGNOSIS — N184 Chronic kidney disease, stage 4 (severe): Secondary | ICD-10-CM | POA: Diagnosis not present

## 2024-02-01 DIAGNOSIS — E039 Hypothyroidism, unspecified: Secondary | ICD-10-CM | POA: Diagnosis not present

## 2024-02-01 DIAGNOSIS — K3184 Gastroparesis: Secondary | ICD-10-CM | POA: Diagnosis not present

## 2024-02-01 DIAGNOSIS — I119 Hypertensive heart disease without heart failure: Secondary | ICD-10-CM | POA: Diagnosis not present

## 2024-04-29 DIAGNOSIS — E119 Type 2 diabetes mellitus without complications: Secondary | ICD-10-CM | POA: Diagnosis not present

## 2024-04-29 DIAGNOSIS — H538 Other visual disturbances: Secondary | ICD-10-CM | POA: Diagnosis not present

## 2024-05-02 DIAGNOSIS — I119 Hypertensive heart disease without heart failure: Secondary | ICD-10-CM | POA: Diagnosis not present

## 2024-05-02 DIAGNOSIS — E039 Hypothyroidism, unspecified: Secondary | ICD-10-CM | POA: Diagnosis not present

## 2024-05-02 DIAGNOSIS — E1129 Type 2 diabetes mellitus with other diabetic kidney complication: Secondary | ICD-10-CM | POA: Diagnosis not present

## 2024-05-02 DIAGNOSIS — K3184 Gastroparesis: Secondary | ICD-10-CM | POA: Diagnosis not present

## 2024-05-02 DIAGNOSIS — N184 Chronic kidney disease, stage 4 (severe): Secondary | ICD-10-CM | POA: Diagnosis not present

## 2024-05-02 DIAGNOSIS — Z794 Long term (current) use of insulin: Secondary | ICD-10-CM | POA: Diagnosis not present

## 2024-05-02 DIAGNOSIS — E1143 Type 2 diabetes mellitus with diabetic autonomic (poly)neuropathy: Secondary | ICD-10-CM | POA: Diagnosis not present

## 2024-06-03 ENCOUNTER — Encounter: Payer: Self-pay | Admitting: Sports Medicine

## 2024-06-03 ENCOUNTER — Other Ambulatory Visit (INDEPENDENT_AMBULATORY_CARE_PROVIDER_SITE_OTHER)

## 2024-06-03 ENCOUNTER — Ambulatory Visit (INDEPENDENT_AMBULATORY_CARE_PROVIDER_SITE_OTHER): Admitting: Sports Medicine

## 2024-06-03 ENCOUNTER — Other Ambulatory Visit: Payer: Self-pay

## 2024-06-03 DIAGNOSIS — M5136 Other intervertebral disc degeneration, lumbar region with discogenic back pain only: Secondary | ICD-10-CM | POA: Diagnosis not present

## 2024-06-03 DIAGNOSIS — M47819 Spondylosis without myelopathy or radiculopathy, site unspecified: Secondary | ICD-10-CM | POA: Diagnosis not present

## 2024-06-03 DIAGNOSIS — M1612 Unilateral primary osteoarthritis, left hip: Secondary | ICD-10-CM | POA: Diagnosis not present

## 2024-06-03 MED ORDER — LIDOCAINE HCL 1 % IJ SOLN
4.0000 mL | INTRAMUSCULAR | Status: AC | PRN
  Administered 2024-06-03: 4 mL

## 2024-06-03 MED ORDER — METHOCARBAMOL 500 MG PO TABS: 500.0000 mg | ORAL_TABLET | Freq: Two times a day (BID) | ORAL | 1 refills | Status: AC | PRN

## 2024-06-03 MED ORDER — METHYLPREDNISOLONE ACETATE 40 MG/ML IJ SUSP
40.0000 mg | INTRAMUSCULAR | Status: AC | PRN
Start: 2024-06-03 — End: 2024-06-03
  Administered 2024-06-03: 40 mg via INTRA_ARTICULAR
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
## 2024-06-03 NOTE — Progress Notes (Signed)
 Patient says that the injections she gets in her back to not seem to help her hip pain. She points to her left lower back when describing her pain, but says that she does get pain through the front of the hip and into the groin. She does not remember the relief that she got from her left hip injection 08/2023. She says that she will have weakness through the entire left leg, as well as numbness in the left quad, especially after doing more activity. She does not take medicine for the left hip, but does use lidocaine  patches as well as various topical treatments, and heat.

## 2024-06-27 ENCOUNTER — Ambulatory Visit: Admitting: Sports Medicine

## 2024-06-27 DIAGNOSIS — M62830 Muscle spasm of back: Secondary | ICD-10-CM

## 2024-06-27 DIAGNOSIS — M5136 Other intervertebral disc degeneration, lumbar region with discogenic back pain only: Secondary | ICD-10-CM | POA: Diagnosis not present

## 2024-06-27 DIAGNOSIS — M1612 Unilateral primary osteoarthritis, left hip: Secondary | ICD-10-CM

## 2024-06-27 DIAGNOSIS — G8929 Other chronic pain: Secondary | ICD-10-CM

## 2024-06-27 DIAGNOSIS — M5459 Other low back pain: Secondary | ICD-10-CM

## 2024-06-27 DIAGNOSIS — M545 Low back pain, unspecified: Secondary | ICD-10-CM

## 2024-06-27 MED ORDER — METHYLPREDNISOLONE ACETATE 40 MG/ML IJ SUSP
40.0000 mg | INTRAMUSCULAR | Status: AC | PRN
  Administered 2024-06-27: 40 mg via INTRAMUSCULAR

## 2024-06-27 MED ORDER — BUPIVACAINE HCL 0.25 % IJ SOLN
1.0000 mL | INTRAMUSCULAR | Status: AC | PRN
  Administered 2024-06-27: 1 mL

## 2024-06-27 MED ORDER — LIDOCAINE HCL 1 % IJ SOLN
1.0000 mL | INTRAMUSCULAR | Status: AC | PRN
  Administered 2024-06-27: 1 mL

## 2024-06-27 NOTE — Progress Notes (Signed)
 Patient says that she got complete relief of pain for about 2 weeks. She says that her pain returned in the low back, and she does not notice any pain, numbness, or tingling going down the leg.
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
## 2024-07-17 DIAGNOSIS — N1832 Chronic kidney disease, stage 3b: Secondary | ICD-10-CM | POA: Diagnosis not present

## 2024-07-17 DIAGNOSIS — E1122 Type 2 diabetes mellitus with diabetic chronic kidney disease: Secondary | ICD-10-CM | POA: Diagnosis not present

## 2024-07-17 DIAGNOSIS — I129 Hypertensive chronic kidney disease with stage 1 through stage 4 chronic kidney disease, or unspecified chronic kidney disease: Secondary | ICD-10-CM | POA: Diagnosis not present
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
## 2024-07-24 ENCOUNTER — Other Ambulatory Visit: Payer: Self-pay

## 2024-07-24 ENCOUNTER — Ambulatory Visit: Attending: Sports Medicine

## 2024-07-24 DIAGNOSIS — M5136 Other intervertebral disc degeneration, lumbar region with discogenic back pain only: Secondary | ICD-10-CM | POA: Insufficient documentation

## 2024-07-24 DIAGNOSIS — M545 Low back pain, unspecified: Secondary | ICD-10-CM | POA: Insufficient documentation

## 2024-07-24 DIAGNOSIS — M6281 Muscle weakness (generalized): Secondary | ICD-10-CM | POA: Diagnosis not present

## 2024-07-24 DIAGNOSIS — G8929 Other chronic pain: Secondary | ICD-10-CM | POA: Insufficient documentation

## 2024-07-24 DIAGNOSIS — R2689 Other abnormalities of gait and mobility: Secondary | ICD-10-CM | POA: Insufficient documentation

## 2024-07-25 ENCOUNTER — Other Ambulatory Visit: Payer: Self-pay | Admitting: Physician Assistant

## 2024-07-25 DIAGNOSIS — N184 Chronic kidney disease, stage 4 (severe): Secondary | ICD-10-CM | POA: Diagnosis not present

## 2024-07-25 DIAGNOSIS — Z794 Long term (current) use of insulin: Secondary | ICD-10-CM | POA: Diagnosis not present

## 2024-07-25 DIAGNOSIS — I119 Hypertensive heart disease without heart failure: Secondary | ICD-10-CM | POA: Diagnosis not present

## 2024-07-25 DIAGNOSIS — E1143 Type 2 diabetes mellitus with diabetic autonomic (poly)neuropathy: Secondary | ICD-10-CM | POA: Diagnosis not present

## 2024-07-25 DIAGNOSIS — E1129 Type 2 diabetes mellitus with other diabetic kidney complication: Secondary | ICD-10-CM | POA: Diagnosis not present

## 2024-07-25 DIAGNOSIS — E039 Hypothyroidism, unspecified: Secondary | ICD-10-CM | POA: Diagnosis not present

## 2024-07-25 DIAGNOSIS — Z1231 Encounter for screening mammogram for malignant neoplasm of breast: Secondary | ICD-10-CM

## 2024-07-25 DIAGNOSIS — K3184 Gastroparesis: Secondary | ICD-10-CM | POA: Diagnosis not present

## 2024-07-31 ENCOUNTER — Ambulatory Visit

## 2024-07-31 DIAGNOSIS — G8929 Other chronic pain: Secondary | ICD-10-CM

## 2024-07-31 DIAGNOSIS — R2689 Other abnormalities of gait and mobility: Secondary | ICD-10-CM

## 2024-07-31 DIAGNOSIS — M6281 Muscle weakness (generalized): Secondary | ICD-10-CM

## 2024-07-31 DIAGNOSIS — M5136 Other intervertebral disc degeneration, lumbar region with discogenic back pain only: Secondary | ICD-10-CM | POA: Diagnosis not present

## 2024-07-31 DIAGNOSIS — M545 Low back pain, unspecified: Secondary | ICD-10-CM | POA: Diagnosis not present
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
## 2024-08-06 ENCOUNTER — Encounter: Payer: Self-pay | Admitting: Physical Therapy

## 2024-08-06 ENCOUNTER — Ambulatory Visit: Attending: Sports Medicine | Admitting: Physical Therapy

## 2024-08-06 DIAGNOSIS — M545 Low back pain, unspecified: Secondary | ICD-10-CM | POA: Diagnosis present

## 2024-08-06 DIAGNOSIS — R2689 Other abnormalities of gait and mobility: Secondary | ICD-10-CM | POA: Diagnosis present

## 2024-08-06 DIAGNOSIS — G8929 Other chronic pain: Secondary | ICD-10-CM | POA: Diagnosis present

## 2024-08-06 DIAGNOSIS — M6281 Muscle weakness (generalized): Secondary | ICD-10-CM | POA: Diagnosis present
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
## 2024-08-08 ENCOUNTER — Ambulatory Visit
Admission: RE | Admit: 2024-08-08 | Discharge: 2024-08-08 | Disposition: A | Discharge status: Home / Self Care | Source: Ambulatory Visit | Attending: Physician Assistant | Admitting: Physician Assistant

## 2024-08-08 DIAGNOSIS — Z1231 Encounter for screening mammogram for malignant neoplasm of breast: Secondary | ICD-10-CM

## 2024-08-13 ENCOUNTER — Ambulatory Visit

## 2024-08-13 DIAGNOSIS — M545 Low back pain, unspecified: Secondary | ICD-10-CM

## 2024-08-13 DIAGNOSIS — R2689 Other abnormalities of gait and mobility: Secondary | ICD-10-CM

## 2024-08-13 DIAGNOSIS — M6281 Muscle weakness (generalized): Secondary | ICD-10-CM
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
## 2024-08-15 ENCOUNTER — Encounter: Payer: Self-pay | Admitting: Physical Therapy

## 2024-08-15 ENCOUNTER — Ambulatory Visit: Admitting: Physical Therapy

## 2024-08-15 DIAGNOSIS — M6281 Muscle weakness (generalized): Secondary | ICD-10-CM

## 2024-08-15 DIAGNOSIS — M545 Low back pain, unspecified: Secondary | ICD-10-CM | POA: Diagnosis not present

## 2024-08-15 DIAGNOSIS — R2689 Other abnormalities of gait and mobility: Secondary | ICD-10-CM
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
## 2024-08-20 ENCOUNTER — Ambulatory Visit

## 2024-08-20 DIAGNOSIS — M545 Low back pain, unspecified: Secondary | ICD-10-CM

## 2024-08-20 DIAGNOSIS — R2689 Other abnormalities of gait and mobility: Secondary | ICD-10-CM

## 2024-08-20 DIAGNOSIS — M6281 Muscle weakness (generalized): Secondary | ICD-10-CM
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
## 2024-08-22 ENCOUNTER — Ambulatory Visit: Admitting: Physical Therapy

## 2024-08-22 ENCOUNTER — Encounter: Payer: Self-pay | Admitting: Physical Therapy

## 2024-08-22 DIAGNOSIS — R2689 Other abnormalities of gait and mobility: Secondary | ICD-10-CM

## 2024-08-22 DIAGNOSIS — M6281 Muscle weakness (generalized): Secondary | ICD-10-CM

## 2024-08-22 DIAGNOSIS — G8929 Other chronic pain: Secondary | ICD-10-CM

## 2024-08-22 DIAGNOSIS — M545 Low back pain, unspecified: Secondary | ICD-10-CM | POA: Diagnosis not present
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
## 2024-08-27 ENCOUNTER — Ambulatory Visit
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
## 2024-09-05 ENCOUNTER — Ambulatory Visit

## 2024-09-05 DIAGNOSIS — E1129 Type 2 diabetes mellitus with other diabetic kidney complication: Secondary | ICD-10-CM | POA: Diagnosis not present

## 2024-09-05 DIAGNOSIS — K3184 Gastroparesis: Secondary | ICD-10-CM | POA: Diagnosis not present

## 2024-09-05 DIAGNOSIS — Z794 Long term (current) use of insulin: Secondary | ICD-10-CM | POA: Diagnosis not present

## 2024-09-05 DIAGNOSIS — N184 Chronic kidney disease, stage 4 (severe): Secondary | ICD-10-CM | POA: Diagnosis not present

## 2024-09-05 DIAGNOSIS — E1143 Type 2 diabetes mellitus with diabetic autonomic (poly)neuropathy: Secondary | ICD-10-CM | POA: Diagnosis not present

## 2024-09-05 DIAGNOSIS — I119 Hypertensive heart disease without heart failure: Secondary | ICD-10-CM | POA: Diagnosis not present

## 2024-09-05 DIAGNOSIS — E039 Hypothyroidism, unspecified: Secondary | ICD-10-CM | POA: Diagnosis not present

## 2024-09-08 ENCOUNTER — Encounter: Payer: Self-pay | Admitting: Radiology
